# Patient Record
Sex: Male | Born: 1935 | Race: White | Hispanic: No | Smoking: Never smoker
Health system: Southern US, Community
[De-identification: ages and names within clinical notes are randomized; demographics above are authoritative.]

## PROBLEM LIST (undated history)

## (undated) DIAGNOSIS — F0390 Unspecified dementia without behavioral disturbance: Secondary | ICD-10-CM

## (undated) DIAGNOSIS — I10 Essential (primary) hypertension: Secondary | ICD-10-CM

## (undated) HISTORY — PX: KNEE SURGERY: SHX244

---

## 2016-12-01 ENCOUNTER — Emergency Department (HOSPITAL_COMMUNITY): Payer: Medicare Other

## 2016-12-01 ENCOUNTER — Emergency Department (HOSPITAL_COMMUNITY)
Admission: EM | Admit: 2016-12-01 | Discharge: 2016-12-01 | Disposition: A | Discharge status: Home / Self Care | Payer: Medicare Other | Source: Home / Self Care | Attending: Emergency Medicine | Admitting: Emergency Medicine

## 2016-12-01 ENCOUNTER — Encounter (HOSPITAL_COMMUNITY): Payer: Self-pay | Admitting: Emergency Medicine

## 2016-12-01 DIAGNOSIS — W19XXXA Unspecified fall, initial encounter: Secondary | ICD-10-CM | POA: Insufficient documentation

## 2016-12-01 DIAGNOSIS — Z79899 Other long term (current) drug therapy: Secondary | ICD-10-CM

## 2016-12-01 DIAGNOSIS — M25512 Pain in left shoulder: Secondary | ICD-10-CM | POA: Insufficient documentation

## 2016-12-01 DIAGNOSIS — Y999 Unspecified external cause status: Secondary | ICD-10-CM

## 2016-12-01 DIAGNOSIS — S0990XA Unspecified injury of head, initial encounter: Secondary | ICD-10-CM

## 2016-12-01 DIAGNOSIS — S0003XA Contusion of scalp, initial encounter: Secondary | ICD-10-CM | POA: Insufficient documentation

## 2016-12-01 DIAGNOSIS — I6200 Nontraumatic subdural hemorrhage, unspecified: Secondary | ICD-10-CM | POA: Diagnosis not present

## 2016-12-01 DIAGNOSIS — Y92128 Other place in nursing home as the place of occurrence of the external cause: Secondary | ICD-10-CM | POA: Insufficient documentation

## 2016-12-01 DIAGNOSIS — I10 Essential (primary) hypertension: Secondary | ICD-10-CM

## 2016-12-01 DIAGNOSIS — Y939 Activity, unspecified: Secondary | ICD-10-CM | POA: Insufficient documentation

## 2016-12-01 DIAGNOSIS — S065X9A Traumatic subdural hemorrhage with loss of consciousness of unspecified duration, initial encounter: Secondary | ICD-10-CM | POA: Diagnosis not present

## 2016-12-01 HISTORY — DX: Essential (primary) hypertension: I10

## 2016-12-01 HISTORY — DX: Unspecified dementia, unspecified severity, without behavioral disturbance, psychotic disturbance, mood disturbance, and anxiety: F03.90

## 2016-12-01 LAB — CBC WITH DIFFERENTIAL/PLATELET
BASOS ABS: 0 10*3/uL (ref 0.0–0.1)
Basophils Relative: 0 %
EOS PCT: 3 %
Eosinophils Absolute: 0.3 10*3/uL (ref 0.0–0.7)
HEMATOCRIT: 36.6 % — AB (ref 39.0–52.0)
Hemoglobin: 11.7 g/dL — ABNORMAL LOW (ref 13.0–17.0)
LYMPHS PCT: 14 %
Lymphs Abs: 1.2 10*3/uL (ref 0.7–4.0)
MCH: 28.6 pg (ref 26.0–34.0)
MCHC: 32 g/dL (ref 30.0–36.0)
MCV: 89.5 fL (ref 78.0–100.0)
Monocytes Absolute: 0.6 10*3/uL (ref 0.1–1.0)
Monocytes Relative: 7 %
NEUTROS ABS: 6.5 10*3/uL (ref 1.7–7.7)
Neutrophils Relative %: 76 %
PLATELETS: 185 10*3/uL (ref 150–400)
RBC: 4.09 MIL/uL — AB (ref 4.22–5.81)
RDW: 13.8 % (ref 11.5–15.5)
WBC: 8.6 10*3/uL (ref 4.0–10.5)

## 2016-12-01 LAB — URINALYSIS, ROUTINE W REFLEX MICROSCOPIC
BILIRUBIN URINE: NEGATIVE
GLUCOSE, UA: NEGATIVE mg/dL
HGB URINE DIPSTICK: NEGATIVE
Ketones, ur: NEGATIVE mg/dL
Leukocytes, UA: NEGATIVE
Nitrite: NEGATIVE
PH: 8 (ref 5.0–8.0)
Protein, ur: NEGATIVE mg/dL
SPECIFIC GRAVITY, URINE: 1.009 (ref 1.005–1.030)

## 2016-12-01 LAB — COMPREHENSIVE METABOLIC PANEL
ALBUMIN: 4.1 g/dL (ref 3.5–5.0)
ALT: 23 U/L (ref 17–63)
AST: 26 U/L (ref 15–41)
Alkaline Phosphatase: 61 U/L (ref 38–126)
Anion gap: 9 (ref 5–15)
BUN: 16 mg/dL (ref 6–20)
CHLORIDE: 102 mmol/L (ref 101–111)
CO2: 29 mmol/L (ref 22–32)
Calcium: 8.7 mg/dL — ABNORMAL LOW (ref 8.9–10.3)
Creatinine, Ser: 0.71 mg/dL (ref 0.61–1.24)
GFR calc Af Amer: >60 mL/min (ref >60)
GLUCOSE: 93 mg/dL (ref 65–99)
POTASSIUM: 3.7 mmol/L (ref 3.5–5.1)
Sodium: 140 mmol/L (ref 135–145)
Total Bilirubin: 0.4 mg/dL (ref 0.3–1.2)
Total Protein: 6.7 g/dL (ref 6.5–8.1)

## 2016-12-01 NOTE — ED Provider Notes (Signed)
[facility]-EMERGENCY DEPT Provider Note   CSN: [id number] Arrival date & time: 12/01/16  1324     History   Chief Complaint Chief Complaint  Patient presents with  . Fall    HPI Matthew Hancock is a 82 y.o. male.  HPI Level V caveat due to dementia. Patient is resident of nursing home and was found in shower by staff this morning. Patient does not remember fall. Has contusion to the left scalp and left shoulder. Daughters at bedside. States she is concerned the patient missed his dose of Namenda yesterday and today. Had increased confusion yesterday. Past Medical History:  Diagnosis Date  . Dementia   . Hypertension     There are no active problems to display for this patient.   History reviewed. No pertinent surgical history.     Home Medications    Prior to Admission medications   Medication Sig Start Date End Date Taking? Authorizing Provider  B Complex Vitamins (VITAMIN-B COMPLEX PO) Take 1 tablet by mouth daily.   Yes [provider]  Cholecalciferol (VITAMIN D) 2000 units CAPS Take 2,000 Units by mouth daily.   Yes [provider]  citalopram (CELEXA) 20 MG tablet Take 20 mg by mouth daily.   Yes [provider]  Melatonin 10 MG CAPS Take 10 mg by mouth at bedtime.   Yes [provider]  memantine (NAMENDA) 10 MG tablet Take 10 mg by mouth 2 (two) times daily.   Yes [provider]  mineral oil liquid Place 2 mLs in ear(s) daily as needed.   Yes [provider]  Multiple Vitamins-Minerals (MULTIVITAMIN WITH MINERALS) tablet Take 1 tablet by mouth daily.   Yes [provider]  polyethylene glycol (MIRALAX / GLYCOLAX) packet Take 17 g by mouth daily.   Yes [provider]  potassium chloride SA (K-DUR,KLOR-CON) 20 MEQ tablet Take 20 mEq by mouth daily.   Yes [provider]  sertraline (ZOLOFT) 25 MG tablet Take 25 mg by mouth daily. Once daily for 2 weeks. Then d/c   Yes [provider]    Family History No family history on file.  Social History Social History  Substance Use Topics  . Smoking status: Not on file  . Smokeless tobacco: Not on file  . Alcohol use Not on file     Allergies   Patient has no known allergies.   Review of Systems Review of Systems  Unable to perform ROS: Dementia     Physical Exam Updated Vital Signs BP (!) 154/84   Pulse (!) 56   Temp 97.8 F (36.6 C) (Oral)   Resp 19   SpO2 99%   Physical Exam  Constitutional: He appears well-developed and well-nourished.  Drowsy but arousable.  HENT:  Head: Normocephalic.  Mouth/Throat: Oropharynx is clear and moist. No oropharyngeal exudate.  Contusion to the left temporal scalp.  Eyes: EOM are normal. Pupils are equal, round, and reactive to light.  Neck: Normal range of motion. Neck supple.  No posterior midline cervical tenderness to palpation.  Cardiovascular: Regular rhythm.   Bradycardia  Pulmonary/Chest: Effort normal and breath sounds normal. No respiratory distress. He has no wheezes. He has no rales. He exhibits no tenderness.  Abdominal: Soft. Bowel sounds are normal. There is no tenderness. There is no rebound and no guarding.  Musculoskeletal: Normal range of motion. He exhibits tenderness. He exhibits no edema.  Patient with redness overlying the left anterior lateral shoulder. There is tenderness to palpation and  with range of motion. No obvious deformity. Distal pulses are 2+. Pelvis is stable.  Neurological:  Moves all extremities without focal deficit. Drowsy but arousable. Follows simple commands.  Skin: Skin is warm and dry. Capillary refill takes less than 2 seconds. No rash noted. No erythema.  Nursing note and vitals reviewed.    ED Treatments / Results  Labs (all labs ordered are listed, but only abnormal results are displayed) Labs Reviewed  CBC WITH DIFFERENTIAL/PLATELET - Abnormal; Notable for the following:       Result Value   RBC 4.09 (*)      Hemoglobin 11.7 (*)    HCT 36.6 (*)    All other components within normal limits  COMPREHENSIVE METABOLIC PANEL - Abnormal; Notable for the following:    Calcium 8.7 (*)    All other components within normal limits  URINALYSIS, ROUTINE W REFLEX MICROSCOPIC - Abnormal; Notable for the following:    Color, Urine STRAW (*)    All other components within normal limits    EKG  EKG Interpretation None       Radiology Ct Head Wo Contrast  Result Date: 12/01/2016 CLINICAL DATA:  Unwitnessed fall. Found sitting in shower with the hematoma along the left side of the head and left shoulder. Baseline dementia. EXAM: CT HEAD WITHOUT CONTRAST TECHNIQUE: Contiguous axial images were obtained from the base of the skull through the vertex without intravenous contrast. COMPARISON:  None. FINDINGS: Brain: Mild atrophy and white matter changes are evident. No acute infarct, hemorrhage, or mass lesion is present. Ventricles are of normal size. No significant extra-axial collection is present. Vascular: Atherosclerotic calcifications are present within the cavernous internal carotid artery's bilaterally. No hyperdense vessel is present. Skull: A left frontal scalp hematoma is present. There is no underlying fracture. The calvarium is intact. No focal lytic or blastic lesions are present. Sinuses/Orbits: The paranasal sinuses and mastoid air cells are clear. The globes and orbits are within normal limits bilaterally. IMPRESSION: 1. Left frontal scalp hematoma without underlying fracture. 2. No acute intracranial abnormality. 3. Mild atrophy and white matter disease likely reflects the sequela of chronic microvascular ischemia. Electronically Signed   By: Marin Robertshristopher  Mattern M.D.   On: 12/01/2016 11:13   Dg Shoulder Left  Result Date: 12/01/2016 CLINICAL DATA:  Fall. EXAM: LEFT SHOULDER - 2+ VIEW COMPARISON:  None. FINDINGS: Left shoulder hemi arthroplasty device is identified. The hardware components are in  anatomic alignment. No periprosthetic fracture. Mild degenerative changes noted at the acromioclavicular joint. IMPRESSION: 1. No acute finding. 2. Previous left shoulder arthroplasty. Electronically Signed   By: Signa Kellaylor  Stroud M.D.   On: 12/01/2016 10:01    Procedures Procedures (including critical care time)  Medications Ordered in ED Medications - No data to display   Initial Impression / Assessment and Plan / ED Course  I have reviewed the triage vital signs and the nursing notes.  Pertinent labs & imaging results that were available during my care of the patient were reviewed by me and considered in my medical decision making (see chart for details).     CT head without acute findings. Left shoulder x-ray without evidence of bony abnormality. The patient is more alert and interactive with staff. Ambulating with assistance. Vital signs remained stable. Question whether some of patient's drowsiness may be due to oversedation. Patient does receive prn Ativan as needed for agitation. We'll discharge home with patient's daughter. Return precautions given.  Final Clinical Impressions(s) / ED Diagnoses   Final  diagnoses:  Minor head injury, initial encounter  Fall, initial encounter    New Prescriptions New Prescriptions   No medications on file     Loren Racer, MD 12/01/16 1331

## 2016-12-01 NOTE — ED Triage Notes (Addendum)
[facility] [facility] for [facility] unwitnessed [facility]. Patient was found by staff this morning sitting in the shower with a hematoma to the left head and left shoulder.  Patient alert and oriented to self, history of dementia, orientation reported to be at baseline to EMS per facility.  Patient in no apparent distress at this time.  Able to move all extremities, complains of soreness to left shoulder.  Patient bradycardic with rate in high 40's to low 50's.  Multiple pulse checks recorded from facility to heart rate in mid 50's.

## 2016-12-01 NOTE — ED Notes (Signed)
Got patient into a ghown on the monitor did ekg shown to Dr Ranae PalmsYelverton

## 2016-12-01 NOTE — Discharge Instructions (Signed)
Mr Lennon AlstromBayliss needs to ambulate with walker or cane. May use Tylenol 650 mg every 4 hours as needed for pain. Apply icepack to scalp contusion or left shoulder contusion as needed.

## 2016-12-02 ENCOUNTER — Emergency Department (HOSPITAL_COMMUNITY): Payer: Medicare Other

## 2016-12-02 ENCOUNTER — Inpatient Hospital Stay (HOSPITAL_COMMUNITY)
Admission: EM | Admit: 2016-12-02 | Discharge: 2016-12-04 | DRG: 083 | Disposition: A | Discharge status: Skilled Nursing Facility | Payer: Medicare Other | Attending: Internal Medicine | Admitting: Internal Medicine

## 2016-12-02 ENCOUNTER — Encounter (HOSPITAL_COMMUNITY): Payer: Self-pay | Admitting: Plastic Surgery

## 2016-12-02 DIAGNOSIS — I6200 Nontraumatic subdural hemorrhage, unspecified: Secondary | ICD-10-CM

## 2016-12-02 DIAGNOSIS — Z79899 Other long term (current) drug therapy: Secondary | ICD-10-CM

## 2016-12-02 DIAGNOSIS — R350 Frequency of micturition: Secondary | ICD-10-CM | POA: Diagnosis present

## 2016-12-02 DIAGNOSIS — I4510 Unspecified right bundle-branch block: Secondary | ICD-10-CM | POA: Diagnosis present

## 2016-12-02 DIAGNOSIS — R930 Abnormal findings on diagnostic imaging of skull and head, not elsewhere classified: Secondary | ICD-10-CM | POA: Diagnosis present

## 2016-12-02 DIAGNOSIS — W19XXXA Unspecified fall, initial encounter: Secondary | ICD-10-CM | POA: Diagnosis not present

## 2016-12-02 DIAGNOSIS — Y92121 Bathroom in nursing home as the place of occurrence of the external cause: Secondary | ICD-10-CM

## 2016-12-02 DIAGNOSIS — F0390 Unspecified dementia without behavioral disturbance: Secondary | ICD-10-CM | POA: Diagnosis not present

## 2016-12-02 DIAGNOSIS — K5900 Constipation, unspecified: Secondary | ICD-10-CM | POA: Diagnosis present

## 2016-12-02 DIAGNOSIS — F05 Delirium due to known physiological condition: Secondary | ICD-10-CM | POA: Diagnosis present

## 2016-12-02 DIAGNOSIS — R9431 Abnormal electrocardiogram [ECG] [EKG]: Secondary | ICD-10-CM | POA: Diagnosis present

## 2016-12-02 DIAGNOSIS — I4581 Long QT syndrome: Secondary | ICD-10-CM | POA: Diagnosis present

## 2016-12-02 DIAGNOSIS — S065XAA Traumatic subdural hemorrhage with loss of consciousness status unknown, initial encounter: Secondary | ICD-10-CM | POA: Diagnosis present

## 2016-12-02 DIAGNOSIS — L8990 Pressure ulcer of unspecified site, unspecified stage: Secondary | ICD-10-CM | POA: Diagnosis present

## 2016-12-02 DIAGNOSIS — S065X9A Traumatic subdural hemorrhage with loss of consciousness of unspecified duration, initial encounter: Secondary | ICD-10-CM | POA: Diagnosis present

## 2016-12-02 DIAGNOSIS — N400 Enlarged prostate without lower urinary tract symptoms: Secondary | ICD-10-CM | POA: Diagnosis present

## 2016-12-02 DIAGNOSIS — H9190 Unspecified hearing loss, unspecified ear: Secondary | ICD-10-CM | POA: Diagnosis present

## 2016-12-02 DIAGNOSIS — N401 Enlarged prostate with lower urinary tract symptoms: Secondary | ICD-10-CM | POA: Diagnosis present

## 2016-12-02 DIAGNOSIS — S62525A Nondisplaced fracture of distal phalanx of left thumb, initial encounter for closed fracture: Secondary | ICD-10-CM | POA: Diagnosis present

## 2016-12-02 DIAGNOSIS — W182XXA Fall in (into) shower or empty bathtub, initial encounter: Secondary | ICD-10-CM | POA: Diagnosis present

## 2016-12-02 DIAGNOSIS — I10 Essential (primary) hypertension: Secondary | ICD-10-CM | POA: Diagnosis present

## 2016-12-02 DIAGNOSIS — S0011XA Contusion of right eyelid and periocular area, initial encounter: Secondary | ICD-10-CM | POA: Diagnosis present

## 2016-12-02 DIAGNOSIS — D72829 Elevated white blood cell count, unspecified: Secondary | ICD-10-CM | POA: Diagnosis present

## 2016-12-02 DIAGNOSIS — E785 Hyperlipidemia, unspecified: Secondary | ICD-10-CM | POA: Diagnosis present

## 2016-12-02 DIAGNOSIS — F329 Major depressive disorder, single episode, unspecified: Secondary | ICD-10-CM | POA: Diagnosis present

## 2016-12-02 DIAGNOSIS — Y93E1 Activity, personal bathing and showering: Secondary | ICD-10-CM

## 2016-12-02 DIAGNOSIS — Y92009 Unspecified place in unspecified non-institutional (private) residence as the place of occurrence of the external cause: Secondary | ICD-10-CM

## 2016-12-02 DIAGNOSIS — E876 Hypokalemia: Secondary | ICD-10-CM | POA: Diagnosis present

## 2016-12-02 LAB — CBC WITH DIFFERENTIAL/PLATELET
Basophils Absolute: 0 10*3/uL (ref 0.0–0.1)
Basophils Relative: 0 %
EOS ABS: 0 10*3/uL (ref 0.0–0.7)
EOS PCT: 0 %
HCT: 34.9 % — ABNORMAL LOW (ref 39.0–52.0)
Hemoglobin: 11 g/dL — ABNORMAL LOW (ref 13.0–17.0)
LYMPHS ABS: 0.8 10*3/uL (ref 0.7–4.0)
Lymphocytes Relative: 6 %
MCH: 28.1 pg (ref 26.0–34.0)
MCHC: 31.5 g/dL (ref 30.0–36.0)
MCV: 89.3 fL (ref 78.0–100.0)
MONO ABS: 0.9 10*3/uL (ref 0.1–1.0)
MONOS PCT: 7 %
Neutro Abs: 12.2 10*3/uL — ABNORMAL HIGH (ref 1.7–7.7)
Neutrophils Relative %: 87 %
PLATELETS: 191 10*3/uL (ref 150–400)
RBC: 3.91 MIL/uL — ABNORMAL LOW (ref 4.22–5.81)
RDW: 13.7 % (ref 11.5–15.5)
WBC: 13.9 10*3/uL — ABNORMAL HIGH (ref 4.0–10.5)

## 2016-12-02 LAB — BASIC METABOLIC PANEL
Anion gap: 10 (ref 5–15)
BUN: 16 mg/dL (ref 6–20)
CALCIUM: 8.6 mg/dL — AB (ref 8.9–10.3)
CO2: 27 mmol/L (ref 22–32)
CREATININE: 0.8 mg/dL (ref 0.61–1.24)
Chloride: 101 mmol/L (ref 101–111)
GFR calc Af Amer: >60 mL/min (ref >60)
Glucose, Bld: 132 mg/dL — ABNORMAL HIGH (ref 65–99)
Potassium: 3 mmol/L — ABNORMAL LOW (ref 3.5–5.1)
SODIUM: 138 mmol/L (ref 135–145)

## 2016-12-02 LAB — URINALYSIS, ROUTINE W REFLEX MICROSCOPIC
BACTERIA UA: NONE SEEN
Bilirubin Urine: NEGATIVE
GLUCOSE, UA: NEGATIVE mg/dL
Ketones, ur: 5 mg/dL — AB
Leukocytes, UA: NEGATIVE
Nitrite: NEGATIVE
PH: 7 (ref 5.0–8.0)
Protein, ur: NEGATIVE mg/dL
Specific Gravity, Urine: 1.009 (ref 1.005–1.030)

## 2016-12-02 LAB — PROTIME-INR
INR: 1.07
PROTHROMBIN TIME: 14 s (ref 11.4–15.2)

## 2016-12-02 LAB — TROPONIN I

## 2016-12-02 MED ORDER — LORAZEPAM 2 MG/ML IJ SOLN
INTRAMUSCULAR | Status: AC
  Administered 2016-12-02: 1 mg
  Filled 2016-12-02: qty 1

## 2016-12-02 MED ORDER — HALOPERIDOL LACTATE 5 MG/ML IJ SOLN: 1.0000 mg | Freq: Four times a day (QID) | INTRAMUSCULAR | Status: DC | PRN

## 2016-12-02 MED ORDER — POLYETHYLENE GLYCOL 3350 17 G PO PACK
17.0000 g | PACK | ORAL | Status: DC
  Administered 2016-12-03: 17 g via ORAL
  Filled 2016-12-02: qty 1

## 2016-12-02 MED ORDER — SODIUM CHLORIDE 0.9% FLUSH
3.0000 mL | Freq: Two times a day (BID) | INTRAVENOUS | Status: DC
  Administered 2016-12-02 – 2016-12-04 (×5): 3 mL via INTRAVENOUS

## 2016-12-02 MED ORDER — POTASSIUM CHLORIDE IN NACL 40-0.9 MEQ/L-% IV SOLN
INTRAVENOUS | Status: AC
  Administered 2016-12-02: 100 mL/h via INTRAVENOUS
  Filled 2016-12-02: qty 1000

## 2016-12-02 MED ORDER — SERTRALINE HCL 25 MG PO TABS
25.0000 mg | ORAL_TABLET | Freq: Every day | ORAL | Status: DC
  Administered 2016-12-02: 25 mg via ORAL
  Filled 2016-12-02: qty 1

## 2016-12-02 MED ORDER — MEMANTINE HCL 10 MG PO TABS
10.0000 mg | ORAL_TABLET | Freq: Two times a day (BID) | ORAL | Status: DC
  Administered 2016-12-02 – 2016-12-04 (×5): 10 mg via ORAL
  Filled 2016-12-02 (×7): qty 1

## 2016-12-02 MED ORDER — ACETAMINOPHEN 650 MG RE SUPP: 650.0000 mg | Freq: Four times a day (QID) | RECTAL | Status: DC | PRN

## 2016-12-02 MED ORDER — LORAZEPAM 2 MG/ML IJ SOLN
1.0000 mg | Freq: Three times a day (TID) | INTRAMUSCULAR | Status: DC | PRN
  Administered 2016-12-03: 1 mg via INTRAVENOUS
  Filled 2016-12-02: qty 1

## 2016-12-02 MED ORDER — ACETAMINOPHEN 325 MG PO TABS: 650.0000 mg | ORAL_TABLET | Freq: Four times a day (QID) | ORAL | Status: DC | PRN

## 2016-12-02 MED ORDER — POTASSIUM CHLORIDE CRYS ER 20 MEQ PO TBCR
40.0000 meq | EXTENDED_RELEASE_TABLET | Freq: Once | ORAL | Status: AC
  Administered 2016-12-02: 40 meq via ORAL
  Filled 2016-12-02: qty 2

## 2016-12-02 MED ORDER — HALOPERIDOL LACTATE 5 MG/ML IJ SOLN
2.0000 mg | Freq: Four times a day (QID) | INTRAMUSCULAR | Status: DC | PRN
  Administered 2016-12-02: 2 mg via INTRAVENOUS
  Filled 2016-12-02: qty 1

## 2016-12-02 MED ORDER — POTASSIUM CHLORIDE IN NACL 40-0.9 MEQ/L-% IV SOLN: INTRAVENOUS | Status: DC

## 2016-12-02 MED ORDER — POTASSIUM CHLORIDE CRYS ER 20 MEQ PO TBCR
20.0000 meq | EXTENDED_RELEASE_TABLET | Freq: Every day | ORAL | Status: DC
  Administered 2016-12-03: 20 meq via ORAL
  Filled 2016-12-02: qty 1

## 2016-12-02 MED ORDER — ONDANSETRON HCL 4 MG/2ML IJ SOLN
4.0000 mg | Freq: Four times a day (QID) | INTRAMUSCULAR | Status: DC | PRN
  Filled 2016-12-02: qty 2

## 2016-12-02 MED ORDER — ONDANSETRON HCL 4 MG PO TABS: 4.0000 mg | ORAL_TABLET | Freq: Four times a day (QID) | ORAL | Status: DC | PRN

## 2016-12-02 MED ORDER — CITALOPRAM HYDROBROMIDE 10 MG PO TABS
20.0000 mg | ORAL_TABLET | Freq: Every day | ORAL | Status: DC
  Administered 2016-12-02: 20 mg via ORAL
  Filled 2016-12-02: qty 2

## 2016-12-02 NOTE — Progress Notes (Addendum)
PROGRESS NOTE                                                                                                                                                                                                             Patient Demographics:    Matthew Hancock, is a 83 y.o. male, DOB - 01/09/1928, AVW:[id number]  Admit date - 12/02/2016   Admitting Physician No admitting provider for patient encounter.  Outpatient Primary MD for the patient is System, Provider Not In  LOS - 0  Outpatient Specialists: none  Chief Complaint  Patient presents with  . Fall       Brief Narrative   83 year old male with severe dementia, resident of a memory care unit brought by EMS after found down on the floor. He had an unwitnessed fall while in the shower on the morning of admission, brought to the ED and found to have multiple bruising on the left side of the body. Head CT and left shoulder x-ray were negative and patient was discharged back to the facility. His daughter was checking him frequently at the memory care unit. Patient had his supper but was found on the floor again around 11 PM and sent back to the ED. He was found to have new bruising around his right eye and drowsy. No witnessed seizures, nausea, vomiting, fever, chills, bowel or urinary incontinence. Repeat head CT in the ED showed 5 mm subdural hematoma over the left parietal lobe  and blood in the maxillary sinus. Also had a nondisplaced fracture involving his left thumb. Vitals in the ED was stable. Blood work showed WBC of 13.9 K, potassium of 3. EKG with normal sinus rhythm, PACs and RBBB. Chest x-ray unremarkable. Neurosurgeon consulted by ED physician who recommended no surgical intervention and monitor. Trauma surgery was consulted and hospitalist admission requested to stepdown unit.    Subjective:   Patient awake and alert, confused at baseline.   Assessment  & Plan :     Principal Problem:   Subdural hematoma (HCC) Secondary to mechanical fall. Admit to stepdown unit. Neuro checks per unit protocol. Avoid narcotics or benzodiazepine as was possible. -No surgical intervention per neurosurgery. -Repeat head CT without contrast in a.m.  Active Problems: Severe dementia with delirium As per daughter at bedside he is currently at baseline mental status. Continue Namenda. Ordered  Youth worker. Given  low dose haldol for agitation. His Qtc is prolonged (527). Will try a low dose ativan for combativeness.     Leukocytosis Possibly reactive due to acute trauma. No signs of infection. Monitor  Maxillary sinus hemorrhage. Secondary to acute trauma. Seen by trauma surgery and recommends no surgical intervention. Keep head of bed elevated, no  nose blowing and ice to face to reduce swelling and bruising.    Hypokalemia Replenish with IV fluids.  First distal phalanx fracture. Possible nondisplaced fracture on x-ray. Splint applied in the ED. Outpatient hand surgery evaluation.  Resume remaining home medications Zoloft, Celexa     Code Status : Full code (discussed with daughter)  Family Communication  : Daughter at bedside  Disposition Plan  : admit to SDU  Barriers For Discharge : Active symptoms  Consults  :   Trauma surgery (Dr. Ulice Bold) Dr   Procedures  :  CT head  DVT Prophylaxis  :  SCD  Lab Results  Component Value Date   PLT 191 12/02/2016    Antibiotics  :    Anti-infectives    None        Objective:   Vitals:   12/02/16 0445 12/02/16 0500 12/02/16 0515 12/02/16 0724  BP:  (!) 151/72 (!) 143/82 135/77  Pulse: (!) 54 (!) 59 62 66  Resp:   18 12  Temp:      TempSrc:      SpO2: 94% 98% 97% 100%    Wt Readings from Last 3 Encounters:  No data found for Wt    No intake or output data in the 24 hours ending 12/02/16 0853   Physical Exam  Gen: not in distress HEENT: ecchymoses of rt periorbital  area, pupils reactive b/l,  moist mucosa, supple neck Chest: clear b/l, no added sounds CVS: N S1&S2, no murmurs, rubs or gallop GI: soft, NT, ND, BS+ Musculoskeletal: warm, no edema, splinting over left hand CNS: alert and awake to commands, moves all limbs, not oriented    Data Review:    CBC  Recent Labs Lab 12/01/16 1007 12/02/16 0041  WBC 8.6 13.9*  HGB 11.7* 11.0*  HCT 36.6* 34.9*  PLT 185 191  MCV 89.5 89.3  MCH 28.6 28.1  MCHC 32.0 31.5  RDW 13.8 13.7  LYMPHSABS 1.2 0.8  MONOABS 0.6 0.9  EOSABS 0.3 0.0  BASOSABS 0.0 0.0    Chemistries   Recent Labs Lab 12/01/16 1007 12/02/16 0041  NA 140 138  K 3.7 3.0*  CL 102 101  CO2 29 27  GLUCOSE 93 132*  BUN 16 16  CREATININE 0.71 0.80  CALCIUM 8.7* 8.6*  AST 26  --   ALT 23  --   ALKPHOS 61  --   BILITOT 0.4  --    ------------------------------------------------------------------------------------------------------------------ No results for input(s): CHOL, HDL, LDLCALC, TRIG, CHOLHDL, LDLDIRECT in the last 72 hours.  No results found for: HGBA1C ------------------------------------------------------------------------------------------------------------------ No results for input(s): TSH, T4TOTAL, T3FREE, THYROIDAB in the last 72 hours.  Invalid input(s): FREET3 ------------------------------------------------------------------------------------------------------------------ No results for input(s): VITAMINB12, FOLATE, FERRITIN, TIBC, IRON, RETICCTPCT in the last 72 hours.  Coagulation profile No results for input(s): INR, PROTIME in the last 168 hours.  No results for input(s): DDIMER in the last 72 hours.  Cardiac Enzymes  Recent Labs Lab 12/02/16 0041  TROPONINI <0.03   ------------------------------------------------------------------------------------------------------------------ No results found for: BNP  Inpatient Medications  Scheduled Meds: . citalopram  20 mg Oral Daily  .  memantine  10 mg Oral BID  . [START ON 12/03/2016] polyethylene glycol  17 g Oral Q3 days  . potassium chloride SA  20 mEq Oral Daily  . potassium chloride  40 mEq Oral Once  . sertraline  25 mg Oral Daily  . sodium chloride flush  3 mL Intravenous Q12H   Continuous Infusions: . 0.9 % NaCl with KCl 40 mEq / L     PRN Meds:.acetaminophen **OR** acetaminophen, ondansetron **OR** ondansetron (ZOFRAN) IV  Micro Results No results found for this or any previous visit (from the past 240 hour(s)).  Radiology Reports Dg Chest 2 View  Result Date: 12/02/2016 CLINICAL DATA:  Status post unwitnessed fall. Concern for chest injury. Initial encounter. EXAM: CHEST  2 VIEW COMPARISON:  None. FINDINGS: The lungs are well-aerated and clear. There is no evidence of focal opacification, pleural effusion or pneumothorax. The heart is borderline normal in size. No acute osseous abnormalities are seen. Anterior bridging osteophytes are seen along the thoracic spine. The patient's right shoulder arthroplasty is only minimally imaged. IMPRESSION: No acute cardiopulmonary process seen. No displaced rib fracture seen. Electronically Signed   By: Roanna Raider M.D.   On: 12/02/2016 02:04   Ct Head Wo Contrast  Result Date: 12/02/2016 CLINICAL DATA:  Status post unwitnessed fall. Bruising about the right forehead and right orbit. Vomiting. Concern for head or cervical spine injury. Initial encounter. EXAM: CT HEAD WITHOUT CONTRAST CT MAXILLOFACIAL WITHOUT CONTRAST CT CERVICAL SPINE WITHOUT CONTRAST TECHNIQUE: Multidetector CT imaging of the head, cervical spine, and maxillofacial structures were performed using the standard protocol without intravenous contrast. Multiplanar CT image reconstructions of the cervical spine and maxillofacial structures were also generated. COMPARISON:  CT of the head performed 12/01/2016 FINDINGS: CT HEAD FINDINGS Brain: There is suggestion of a small 5 mm acute subdural hematoma overlying  the left parietal lobe, new from the recent prior study. Prominence of the ventricles and sulci reflects mild to moderate cortical volume loss. Scattered periventricular and subcortical white matter change likely reflects small vessel ischemic microangiopathy. The brainstem and fourth ventricle are within normal limits. The basal ganglia are unremarkable in appearance. The cerebral hemispheres demonstrate grossly normal gray-white differentiation. No mass effect or midline shift is seen. Vascular: No hyperdense vessel or unexpected calcification. Skull: There is no evidence of fracture; visualized osseous structures are unremarkable in appearance. Other: Prominent soft tissue swelling is noted overlying the right frontal and parietal calvarium, and surrounding the right orbit. Mild soft tissue air at the right side of the nose may reflect an underlying soft tissue laceration. CT MAXILLOFACIAL FINDINGS Osseous: There is no evidence of fracture or dislocation. The maxilla and mandible appear intact. The nasal bone is unremarkable in appearance. Maxillary and mandibular dental caries are noted. Orbits: The orbits are intact bilaterally. Sinuses: Blood is noted partially filling the right maxillary sinus. The remaining visualized paranasal sinuses and mastoid air cells are well-aerated. Soft tissues: Prominent soft tissue swelling noted overlying the right frontal and parietal calvarium, and surrounding the right orbit to. The parapharyngeal fat planes are preserved. The nasopharynx, oropharynx and hypopharynx are unremarkable in appearance. The visualized portions of the valleculae and piriform sinuses are grossly unremarkable. The parotid and submandibular glands are within normal limits. No cervical lymphadenopathy is seen. CT CERVICAL SPINE FINDINGS Alignment: Normal. Skull base and vertebrae: No acute fracture. No primary bone lesion or focal pathologic process. Soft tissues and spinal canal: No prevertebral fluid  or swelling. No visible canal hematoma. Disc levels:  Mild intervertebral disc space narrowing is noted at C3-C4 and at C6-C7. Underlying facet disease is noted at the mid cervical spine. Upper chest: A 3.4 cm hypodensity is noted at the right thyroid lobe. The visualized lung apices are clear. Other: No additional soft tissue abnormalities are seen. IMPRESSION: 1. Suggestion of small 5 mm acute subdural hematoma overlying the left parietal lobe, new from the recent prior study. 2. No evidence of fracture or subluxation along the cervical spine. 3. Prominent soft tissue swelling overlying the right frontal and parietal calvarium, and surrounding the right orbit. Mild soft tissue air at the right side of the nose may reflect an underlying soft tissue laceration. 4. Blood noted partially filling the right maxillary sinus, new from the recent prior study. 5. Mild to moderate cortical volume loss and scattered small vessel ischemic microangiopathy. 6. Maxillary and mandibular dental caries noted. 7. Mild degenerative change along the cervical spine. 8. **An incidental finding of potential clinical significance has been found. 3.4 cm hypodensity at the right thyroid lobe. Recommend further evaluation with thyroid ultrasound. If patient is clinically hyperthyroid, consider nuclear medicine thyroid uptake and scan.** Critical Value/emergent results were called by telephone at the time of interpretation on 12/02/2016 at 3:04 am to Dr. Glynn Octave, who verbally acknowledged these results. Electronically Signed   By: Roanna Raider M.D.   On: 12/02/2016 03:05   Ct Head Wo Contrast  Result Date: 12/01/2016 CLINICAL DATA:  Unwitnessed fall. Found sitting in shower with the hematoma along the left side of the head and left shoulder. Baseline dementia. EXAM: CT HEAD WITHOUT CONTRAST TECHNIQUE: Contiguous axial images were obtained from the base of the skull through the vertex without intravenous contrast. COMPARISON:  None.  FINDINGS: Brain: Mild atrophy and white matter changes are evident. No acute infarct, hemorrhage, or mass lesion is present. Ventricles are of normal size. No significant extra-axial collection is present. Vascular: Atherosclerotic calcifications are present within the cavernous internal carotid artery's bilaterally. No hyperdense vessel is present. Skull: A left frontal scalp hematoma is present. There is no underlying fracture. The calvarium is intact. No focal lytic or blastic lesions are present. Sinuses/Orbits: The paranasal sinuses and mastoid air cells are clear. The globes and orbits are within normal limits bilaterally. IMPRESSION: 1. Left frontal scalp hematoma without underlying fracture. 2. No acute intracranial abnormality. 3. Mild atrophy and white matter disease likely reflects the sequela of chronic microvascular ischemia. Electronically Signed   By: Marin Roberts M.D.   On: 12/01/2016 11:13   Ct Cervical Spine Wo Contrast  Result Date: 12/02/2016 CLINICAL DATA:  Status post unwitnessed fall. Bruising about the right forehead and right orbit. Vomiting. Concern for head or cervical spine injury. Initial encounter. EXAM: CT HEAD WITHOUT CONTRAST CT MAXILLOFACIAL WITHOUT CONTRAST CT CERVICAL SPINE WITHOUT CONTRAST TECHNIQUE: Multidetector CT imaging of the head, cervical spine, and maxillofacial structures were performed using the standard protocol without intravenous contrast. Multiplanar CT image reconstructions of the cervical spine and maxillofacial structures were also generated. COMPARISON:  CT of the head performed 12/01/2016 FINDINGS: CT HEAD FINDINGS Brain: There is suggestion of a small 5 mm acute subdural hematoma overlying the left parietal lobe, new from the recent prior study. Prominence of the ventricles and sulci reflects mild to moderate cortical volume loss. Scattered periventricular and subcortical white matter change likely reflects small vessel ischemic microangiopathy.  The brainstem and fourth ventricle are within normal limits. The basal ganglia are unremarkable in appearance. The cerebral hemispheres demonstrate grossly normal  gray-white differentiation. No mass effect or midline shift is seen. Vascular: No hyperdense vessel or unexpected calcification. Skull: There is no evidence of fracture; visualized osseous structures are unremarkable in appearance. Other: Prominent soft tissue swelling is noted overlying the right frontal and parietal calvarium, and surrounding the right orbit. Mild soft tissue air at the right side of the nose may reflect an underlying soft tissue laceration. CT MAXILLOFACIAL FINDINGS Osseous: There is no evidence of fracture or dislocation. The maxilla and mandible appear intact. The nasal bone is unremarkable in appearance. Maxillary and mandibular dental caries are noted. Orbits: The orbits are intact bilaterally. Sinuses: Blood is noted partially filling the right maxillary sinus. The remaining visualized paranasal sinuses and mastoid air cells are well-aerated. Soft tissues: Prominent soft tissue swelling noted overlying the right frontal and parietal calvarium, and surrounding the right orbit to. The parapharyngeal fat planes are preserved. The nasopharynx, oropharynx and hypopharynx are unremarkable in appearance. The visualized portions of the valleculae and piriform sinuses are grossly unremarkable. The parotid and submandibular glands are within normal limits. No cervical lymphadenopathy is seen. CT CERVICAL SPINE FINDINGS Alignment: Normal. Skull base and vertebrae: No acute fracture. No primary bone lesion or focal pathologic process. Soft tissues and spinal canal: No prevertebral fluid or swelling. No visible canal hematoma. Disc levels: Mild intervertebral disc space narrowing is noted at C3-C4 and at C6-C7. Underlying facet disease is noted at the mid cervical spine. Upper chest: A 3.4 cm hypodensity is noted at the right thyroid lobe. The  visualized lung apices are clear. Other: No additional soft tissue abnormalities are seen. IMPRESSION: 1. Suggestion of small 5 mm acute subdural hematoma overlying the left parietal lobe, new from the recent prior study. 2. No evidence of fracture or subluxation along the cervical spine. 3. Prominent soft tissue swelling overlying the right frontal and parietal calvarium, and surrounding the right orbit. Mild soft tissue air at the right side of the nose may reflect an underlying soft tissue laceration. 4. Blood noted partially filling the right maxillary sinus, new from the recent prior study. 5. Mild to moderate cortical volume loss and scattered small vessel ischemic microangiopathy. 6. Maxillary and mandibular dental caries noted. 7. Mild degenerative change along the cervical spine. 8. **An incidental finding of potential clinical significance has been found. 3.4 cm hypodensity at the right thyroid lobe. Recommend further evaluation with thyroid ultrasound. If patient is clinically hyperthyroid, consider nuclear medicine thyroid uptake and scan.** Critical Value/emergent results were called by telephone at the time of interpretation on 12/02/2016 at 3:04 am to Dr. Glynn Octave, who verbally acknowledged these results. Electronically Signed   By: Roanna Raider M.D.   On: 12/02/2016 03:05   Dg Shoulder Left  Result Date: 12/02/2016 CLINICAL DATA:  Status post fall, with left shoulder bruising. Initial encounter. EXAM: LEFT SHOULDER - 2+ VIEW COMPARISON:  Left shoulder radiographs performed 12/01/2016 FINDINGS: There is no evidence of fracture or dislocation. The patient's left shoulder arthroplasty appears grossly intact, without evidence of loosening. Degenerative change is noted about the glenoid. Mild degenerative change is noted at the left acromioclavicular joint. No definite soft tissue abnormalities are characterized on radiograph. IMPRESSION: No evidence of fracture or dislocation. Left shoulder  arthroplasty appears grossly intact, without evidence of loosening. Electronically Signed   By: Roanna Raider M.D.   On: 12/02/2016 02:07   Dg Shoulder Left  Result Date: 12/01/2016 CLINICAL DATA:  Fall. EXAM: LEFT SHOULDER - 2+ VIEW COMPARISON:  None. FINDINGS: Left shoulder  hemi arthroplasty device is identified. The hardware components are in anatomic alignment. No periprosthetic fracture. Mild degenerative changes noted at the acromioclavicular joint. IMPRESSION: 1. No acute finding. 2. Previous left shoulder arthroplasty. Electronically Signed   By: Signa Kell M.D.   On: 12/01/2016 10:01   Dg Knee Complete 4 Views Left  Result Date: 12/02/2016 CLINICAL DATA:  Status post unwitnessed fall, with left knee bruising. Initial encounter. EXAM: LEFT KNEE - COMPLETE 4+ VIEW COMPARISON:  None. FINDINGS: There is no evidence of fracture or dislocation. The patient's total knee arthroplasty is grossly unremarkable in appearance, without evidence of loosening. A small knee joint effusion noted. Mild diffuse soft tissue swelling is noted about the patella. Degenerative osseous fragments are noted about the distal quadriceps tendon. Scattered vascular calcifications are seen. IMPRESSION: 1. No evidence of fracture or dislocation. 2. Total knee arthroplasty is grossly unremarkable, without evidence of loosening. 3. Small knee joint effusion noted. 4. Scattered vascular calcifications seen. Electronically Signed   By: Roanna Raider M.D.   On: 12/02/2016 02:05   Dg Hand Complete Left  Result Date: 12/02/2016 CLINICAL DATA:  Left hand pain after a fall, hematoma EXAM: LEFT HAND - COMPLETE 3+ VIEW COMPARISON:  None. FINDINGS: Examination is limited by a pose a shin Ing. Irregular linear lucencies are visualized within the distal phalanx of the first digit at the proximal mid and distal aspect. No subluxation. Advanced arthritic changes at the first [facility] joint. Marked DIP and PIP joint arthritis with narrowing and  osteophyte. IMPRESSION: 1. Multiple irregular lucencies within the proximal mid and distal aspect of the first distal phalanx, suspicious for nondisplaced fractures. 2. Marked arthritic change at the wrist and digits. Electronically Signed   By: Jasmine Pang M.D.   On: 12/02/2016 02:59   Ct Maxillofacial Wo Contrast  Result Date: 12/02/2016 CLINICAL DATA:  Status post unwitnessed fall. Bruising about the right forehead and right orbit. Vomiting. Concern for head or cervical spine injury. Initial encounter. EXAM: CT HEAD WITHOUT CONTRAST CT MAXILLOFACIAL WITHOUT CONTRAST CT CERVICAL SPINE WITHOUT CONTRAST TECHNIQUE: Multidetector CT imaging of the head, cervical spine, and maxillofacial structures were performed using the standard protocol without intravenous contrast. Multiplanar CT image reconstructions of the cervical spine and maxillofacial structures were also generated. COMPARISON:  CT of the head performed 12/01/2016 FINDINGS: CT HEAD FINDINGS Brain: There is suggestion of a small 5 mm acute subdural hematoma overlying the left parietal lobe, new from the recent prior study. Prominence of the ventricles and sulci reflects mild to moderate cortical volume loss. Scattered periventricular and subcortical white matter change likely reflects small vessel ischemic microangiopathy. The brainstem and fourth ventricle are within normal limits. The basal ganglia are unremarkable in appearance. The cerebral hemispheres demonstrate grossly normal gray-white differentiation. No mass effect or midline shift is seen. Vascular: No hyperdense vessel or unexpected calcification. Skull: There is no evidence of fracture; visualized osseous structures are unremarkable in appearance. Other: Prominent soft tissue swelling is noted overlying the right frontal and parietal calvarium, and surrounding the right orbit. Mild soft tissue air at the right side of the nose may reflect an underlying soft tissue laceration. CT  MAXILLOFACIAL FINDINGS Osseous: There is no evidence of fracture or dislocation. The maxilla and mandible appear intact. The nasal bone is unremarkable in appearance. Maxillary and mandibular dental caries are noted. Orbits: The orbits are intact bilaterally. Sinuses: Blood is noted partially filling the right maxillary sinus. The remaining visualized paranasal sinuses and mastoid air cells are well-aerated.  Soft tissues: Prominent soft tissue swelling noted overlying the right frontal and parietal calvarium, and surrounding the right orbit to. The parapharyngeal fat planes are preserved. The nasopharynx, oropharynx and hypopharynx are unremarkable in appearance. The visualized portions of the valleculae and piriform sinuses are grossly unremarkable. The parotid and submandibular glands are within normal limits. No cervical lymphadenopathy is seen. CT CERVICAL SPINE FINDINGS Alignment: Normal. Skull base and vertebrae: No acute fracture. No primary bone lesion or focal pathologic process. Soft tissues and spinal canal: No prevertebral fluid or swelling. No visible canal hematoma. Disc levels: Mild intervertebral disc space narrowing is noted at C3-C4 and at C6-C7. Underlying facet disease is noted at the mid cervical spine. Upper chest: A 3.4 cm hypodensity is noted at the right thyroid lobe. The visualized lung apices are clear. Other: No additional soft tissue abnormalities are seen. IMPRESSION: 1. Suggestion of small 5 mm acute subdural hematoma overlying the left parietal lobe, new from the recent prior study. 2. No evidence of fracture or subluxation along the cervical spine. 3. Prominent soft tissue swelling overlying the right frontal and parietal calvarium, and surrounding the right orbit. Mild soft tissue air at the right side of the nose may reflect an underlying soft tissue laceration. 4. Blood noted partially filling the right maxillary sinus, new from the recent prior study. 5. Mild to moderate cortical  volume loss and scattered small vessel ischemic microangiopathy. 6. Maxillary and mandibular dental caries noted. 7. Mild degenerative change along the cervical spine. 8. **An incidental finding of potential clinical significance has been found. 3.4 cm hypodensity at the right thyroid lobe. Recommend further evaluation with thyroid ultrasound. If patient is clinically hyperthyroid, consider nuclear medicine thyroid uptake and scan.** Critical Value/emergent results were called by telephone at the time of interpretation on 12/02/2016 at 3:04 am to Dr. Glynn Octave, who verbally acknowledged these results. Electronically Signed   By: Roanna Raider M.D.   On: 12/02/2016 03:05    Time Spent in minutes  20   Eddie North M.D on 12/02/2016 at 8:53 AM  Between 7am to 7pm - Pager - [phone]  After 7pm go to [email or web address] - password [email or web address] [facility] -  Office  [phone]

## 2016-12-02 NOTE — ED Notes (Signed)
Preferred Number..[phone], Vanessa DurhamJannet Causey (daugter)

## 2016-12-02 NOTE — Progress Notes (Signed)
Orthopedic Tech Progress Note Patient Details:  Matthew FrankelJack Hancock 01/26/1928 [id number]  Ortho Devices Type of Ortho Device: Arm sling, Thumb spica splint Splint Material: Fiberglass Ortho Device/Splint Location: lue Ortho Device/Splint Interventions: Ordered, Application   [name], [name] [name] 12/02/2016, 4:13 AM

## 2016-12-02 NOTE — ED Notes (Signed)
Pt presents for a fall, this being the second time today after evaluation here today. Pt also has new bruises to his face and his left knee. Facility states the patient is unresponsive which is new for him.

## 2016-12-02 NOTE — Consult Note (Signed)
Reason for Consult: facial trauma Referring Physician: Dr. Lily Kocher  Matthew Hancock is an 83 y.o. male.  HPI: The patient is an 83 yrs old wm here with his daughter for evaluation after a fall at the memory care facility.  He had an unwitnessed fall in the shower.  He has bruising on the right side of his face and periorbital area.  He has no signs of entrapment of his eyes. It is tender to touch but there are no bone anomalies or step offs noted.  He has underling dementia.  His daughter is by his side.  I reviewed the CT and don't see a fracture to explain the sinus fluid.   Past Medical History:  Diagnosis Date  . Dementia   . Hypertension     History reviewed. No pertinent surgical history.  History reviewed. No pertinent family history.  Social History:  has no tobacco, alcohol, and drug history on file.  Allergies: No Known Allergies  Medications: I have reviewed the patient's current medications.  Results for orders placed or performed during the hospital encounter of 12/02/16 (from the past 48 hour(s))  CBC with Differential/Platelet     Status: Abnormal   Collection Time: 12/02/16 12:41 AM  Result Value Ref Range   WBC 13.9 (H) 4.0 - 10.5 K/uL   RBC 3.91 (L) 4.22 - 5.81 MIL/uL   Hemoglobin 11.0 (L) 13.0 - 17.0 g/dL   HCT 34.9 (L) 39.0 - 52.0 %   MCV 89.3 78.0 - 100.0 fL   MCH 28.1 26.0 - 34.0 pg   MCHC 31.5 30.0 - 36.0 g/dL   RDW 13.7 11.5 - 15.5 %   Platelets 191 150 - 400 K/uL   Neutrophils Relative % 87 %   Neutro Abs 12.2 (H) 1.7 - 7.7 K/uL   Lymphocytes Relative 6 %   Lymphs Abs 0.8 0.7 - 4.0 K/uL   Monocytes Relative 7 %   Monocytes Absolute 0.9 0.1 - 1.0 K/uL   Eosinophils Relative 0 %   Eosinophils Absolute 0.0 0.0 - 0.7 K/uL   Basophils Relative 0 %   Basophils Absolute 0.0 0.0 - 0.1 K/uL  Basic metabolic panel     Status: Abnormal   Collection Time: 12/02/16 12:41 AM  Result Value Ref Range   Sodium 138 135 - 145 mmol/L   Potassium 3.0 (L) 3.5 -  5.1 mmol/L    Comment: DELTA CHECK NOTED   Chloride 101 101 - 111 mmol/L   CO2 27 22 - 32 mmol/L   Glucose, Bld 132 (H) 65 - 99 mg/dL   BUN 16 6 - 20 mg/dL   Creatinine, Ser 0.80 0.61 - 1.24 mg/dL   Calcium 8.6 (L) 8.9 - 10.3 mg/dL   GFR calc non Af Amer >60 >60 mL/min   GFR calc Af Amer >60 >60 mL/min    Comment: (NOTE) The eGFR has been calculated using the CKD EPI equation. This calculation has not been validated in all clinical situations. eGFR's persistently <60 mL/min signify possible Chronic Kidney Disease.    Anion gap 10 5 - 15  Troponin I     Status: None   Collection Time: 12/02/16 12:41 AM  Result Value Ref Range   Troponin I <0.03 <0.03 ng/mL  Protime-INR     Status: None   Collection Time: 12/02/16  8:35 AM  Result Value Ref Range   Prothrombin Time 14.0 11.4 - 15.2 seconds   INR 1.07     Dg Chest 2 View  Result Date: 12/02/2016 CLINICAL DATA:  Status post unwitnessed fall. Concern for chest injury. Initial encounter. EXAM: CHEST  2 VIEW COMPARISON:  None. FINDINGS: The lungs are well-aerated and clear. There is no evidence of focal opacification, pleural effusion or pneumothorax. The heart is borderline normal in size. No acute osseous abnormalities are seen. Anterior bridging osteophytes are seen along the thoracic spine. The patient's right shoulder arthroplasty is only minimally imaged. IMPRESSION: No acute cardiopulmonary process seen. No displaced rib fracture seen. Electronically Signed   By: Garald Balding M.D.   On: 12/02/2016 02:04   Ct Head Wo Contrast  Result Date: 12/02/2016 CLINICAL DATA:  Status post unwitnessed fall. Bruising about the right forehead and right orbit. Vomiting. Concern for head or cervical spine injury. Initial encounter. EXAM: CT HEAD WITHOUT CONTRAST CT MAXILLOFACIAL WITHOUT CONTRAST CT CERVICAL SPINE WITHOUT CONTRAST TECHNIQUE: Multidetector CT imaging of the head, cervical spine, and maxillofacial structures were performed using the  standard protocol without intravenous contrast. Multiplanar CT image reconstructions of the cervical spine and maxillofacial structures were also generated. COMPARISON:  CT of the head performed 12/01/2016 FINDINGS: CT HEAD FINDINGS Brain: There is suggestion of a small 5 mm acute subdural hematoma overlying the left parietal lobe, new from the recent prior study. Prominence of the ventricles and sulci reflects mild to moderate cortical volume loss. Scattered periventricular and subcortical white matter change likely reflects small vessel ischemic microangiopathy. The brainstem and fourth ventricle are within normal limits. The basal ganglia are unremarkable in appearance. The cerebral hemispheres demonstrate grossly normal gray-white differentiation. No mass effect or midline shift is seen. Vascular: No hyperdense vessel or unexpected calcification. Skull: There is no evidence of fracture; visualized osseous structures are unremarkable in appearance. Other: Prominent soft tissue swelling is noted overlying the right frontal and parietal calvarium, and surrounding the right orbit. Mild soft tissue air at the right side of the nose may reflect an underlying soft tissue laceration. CT MAXILLOFACIAL FINDINGS Osseous: There is no evidence of fracture or dislocation. The maxilla and mandible appear intact. The nasal bone is unremarkable in appearance. Maxillary and mandibular dental caries are noted. Orbits: The orbits are intact bilaterally. Sinuses: Blood is noted partially filling the right maxillary sinus. The remaining visualized paranasal sinuses and mastoid air cells are well-aerated. Soft tissues: Prominent soft tissue swelling noted overlying the right frontal and parietal calvarium, and surrounding the right orbit to. The parapharyngeal fat planes are preserved. The nasopharynx, oropharynx and hypopharynx are unremarkable in appearance. The visualized portions of the valleculae and piriform sinuses are grossly  unremarkable. The parotid and submandibular glands are within normal limits. No cervical lymphadenopathy is seen. CT CERVICAL SPINE FINDINGS Alignment: Normal. Skull base and vertebrae: No acute fracture. No primary bone lesion or focal pathologic process. Soft tissues and spinal canal: No prevertebral fluid or swelling. No visible canal hematoma. Disc levels: Mild intervertebral disc space narrowing is noted at C3-C4 and at C6-C7. Underlying facet disease is noted at the mid cervical spine. Upper chest: A 3.4 cm hypodensity is noted at the right thyroid lobe. The visualized lung apices are clear. Other: No additional soft tissue abnormalities are seen. IMPRESSION: 1. Suggestion of small 5 mm acute subdural hematoma overlying the left parietal lobe, new from the recent prior study. 2. No evidence of fracture or subluxation along the cervical spine. 3. Prominent soft tissue swelling overlying the right frontal and parietal calvarium, and surrounding the right orbit. Mild soft tissue air at the right side of the  nose may reflect an underlying soft tissue laceration. 4. Blood noted partially filling the right maxillary sinus, new from the recent prior study. 5. Mild to moderate cortical volume loss and scattered small vessel ischemic microangiopathy. 6. Maxillary and mandibular dental caries noted. 7. Mild degenerative change along the cervical spine. 8. **An incidental finding of potential clinical significance has been found. 3.4 cm hypodensity at the right thyroid lobe. Recommend further evaluation with thyroid ultrasound. If patient is clinically hyperthyroid, consider nuclear medicine thyroid uptake and scan.** Critical Value/emergent results were called by telephone at the time of interpretation on 12/02/2016 at 3:04 am to Dr. Ezequiel Essex, who verbally acknowledged these results. Electronically Signed   By: Garald Balding M.D.   On: 12/02/2016 03:05   Ct Head Wo Contrast  Result Date: 12/01/2016 CLINICAL  DATA:  Unwitnessed fall. Found sitting in shower with the hematoma along the left side of the head and left shoulder. Baseline dementia. EXAM: CT HEAD WITHOUT CONTRAST TECHNIQUE: Contiguous axial images were obtained from the base of the skull through the vertex without intravenous contrast. COMPARISON:  None. FINDINGS: Brain: Mild atrophy and white matter changes are evident. No acute infarct, hemorrhage, or mass lesion is present. Ventricles are of normal size. No significant extra-axial collection is present. Vascular: Atherosclerotic calcifications are present within the cavernous internal carotid artery's bilaterally. No hyperdense vessel is present. Skull: A left frontal scalp hematoma is present. There is no underlying fracture. The calvarium is intact. No focal lytic or blastic lesions are present. Sinuses/Orbits: The paranasal sinuses and mastoid air cells are clear. The globes and orbits are within normal limits bilaterally. IMPRESSION: 1. Left frontal scalp hematoma without underlying fracture. 2. No acute intracranial abnormality. 3. Mild atrophy and white matter disease likely reflects the sequela of chronic microvascular ischemia. Electronically Signed   By: San Morelle M.D.   On: 12/01/2016 11:13   Ct Cervical Spine Wo Contrast  Result Date: 12/02/2016 CLINICAL DATA:  Status post unwitnessed fall. Bruising about the right forehead and right orbit. Vomiting. Concern for head or cervical spine injury. Initial encounter. EXAM: CT HEAD WITHOUT CONTRAST CT MAXILLOFACIAL WITHOUT CONTRAST CT CERVICAL SPINE WITHOUT CONTRAST TECHNIQUE: Multidetector CT imaging of the head, cervical spine, and maxillofacial structures were performed using the standard protocol without intravenous contrast. Multiplanar CT image reconstructions of the cervical spine and maxillofacial structures were also generated. COMPARISON:  CT of the head performed 12/01/2016 FINDINGS: CT HEAD FINDINGS Brain: There is suggestion  of a small 5 mm acute subdural hematoma overlying the left parietal lobe, new from the recent prior study. Prominence of the ventricles and sulci reflects mild to moderate cortical volume loss. Scattered periventricular and subcortical white matter change likely reflects small vessel ischemic microangiopathy. The brainstem and fourth ventricle are within normal limits. The basal ganglia are unremarkable in appearance. The cerebral hemispheres demonstrate grossly normal gray-white differentiation. No mass effect or midline shift is seen. Vascular: No hyperdense vessel or unexpected calcification. Skull: There is no evidence of fracture; visualized osseous structures are unremarkable in appearance. Other: Prominent soft tissue swelling is noted overlying the right frontal and parietal calvarium, and surrounding the right orbit. Mild soft tissue air at the right side of the nose may reflect an underlying soft tissue laceration. CT MAXILLOFACIAL FINDINGS Osseous: There is no evidence of fracture or dislocation. The maxilla and mandible appear intact. The nasal bone is unremarkable in appearance. Maxillary and mandibular dental caries are noted. Orbits: The orbits are intact bilaterally. Sinuses: Blood  is noted partially filling the right maxillary sinus. The remaining visualized paranasal sinuses and mastoid air cells are well-aerated. Soft tissues: Prominent soft tissue swelling noted overlying the right frontal and parietal calvarium, and surrounding the right orbit to. The parapharyngeal fat planes are preserved. The nasopharynx, oropharynx and hypopharynx are unremarkable in appearance. The visualized portions of the valleculae and piriform sinuses are grossly unremarkable. The parotid and submandibular glands are within normal limits. No cervical lymphadenopathy is seen. CT CERVICAL SPINE FINDINGS Alignment: Normal. Skull base and vertebrae: No acute fracture. No primary bone lesion or focal pathologic process.  Soft tissues and spinal canal: No prevertebral fluid or swelling. No visible canal hematoma. Disc levels: Mild intervertebral disc space narrowing is noted at C3-C4 and at C6-C7. Underlying facet disease is noted at the mid cervical spine. Upper chest: A 3.4 cm hypodensity is noted at the right thyroid lobe. The visualized lung apices are clear. Other: No additional soft tissue abnormalities are seen. IMPRESSION: 1. Suggestion of small 5 mm acute subdural hematoma overlying the left parietal lobe, new from the recent prior study. 2. No evidence of fracture or subluxation along the cervical spine. 3. Prominent soft tissue swelling overlying the right frontal and parietal calvarium, and surrounding the right orbit. Mild soft tissue air at the right side of the nose may reflect an underlying soft tissue laceration. 4. Blood noted partially filling the right maxillary sinus, new from the recent prior study. 5. Mild to moderate cortical volume loss and scattered small vessel ischemic microangiopathy. 6. Maxillary and mandibular dental caries noted. 7. Mild degenerative change along the cervical spine. 8. **An incidental finding of potential clinical significance has been found. 3.4 cm hypodensity at the right thyroid lobe. Recommend further evaluation with thyroid ultrasound. If patient is clinically hyperthyroid, consider nuclear medicine thyroid uptake and scan.** Critical Value/emergent results were called by telephone at the time of interpretation on 12/02/2016 at 3:04 am to Dr. Ezequiel Essex, who verbally acknowledged these results. Electronically Signed   By: Garald Balding M.D.   On: 12/02/2016 03:05   Dg Shoulder Left  Result Date: 12/02/2016 CLINICAL DATA:  Status post fall, with left shoulder bruising. Initial encounter. EXAM: LEFT SHOULDER - 2+ VIEW COMPARISON:  Left shoulder radiographs performed 12/01/2016 FINDINGS: There is no evidence of fracture or dislocation. The patient's left shoulder  arthroplasty appears grossly intact, without evidence of loosening. Degenerative change is noted about the glenoid. Mild degenerative change is noted at the left acromioclavicular joint. No definite soft tissue abnormalities are characterized on radiograph. IMPRESSION: No evidence of fracture or dislocation. Left shoulder arthroplasty appears grossly intact, without evidence of loosening. Electronically Signed   By: Garald Balding M.D.   On: 12/02/2016 02:07   Dg Shoulder Left  Result Date: 12/01/2016 CLINICAL DATA:  Fall. EXAM: LEFT SHOULDER - 2+ VIEW COMPARISON:  None. FINDINGS: Left shoulder hemi arthroplasty device is identified. The hardware components are in anatomic alignment. No periprosthetic fracture. Mild degenerative changes noted at the acromioclavicular joint. IMPRESSION: 1. No acute finding. 2. Previous left shoulder arthroplasty. Electronically Signed   By: Kerby Moors M.D.   On: 12/01/2016 10:01   Dg Knee Complete 4 Views Left  Result Date: 12/02/2016 CLINICAL DATA:  Status post unwitnessed fall, with left knee bruising. Initial encounter. EXAM: LEFT KNEE - COMPLETE 4+ VIEW COMPARISON:  None. FINDINGS: There is no evidence of fracture or dislocation. The patient's total knee arthroplasty is grossly unremarkable in appearance, without evidence of loosening. A small knee  joint effusion noted. Mild diffuse soft tissue swelling is noted about the patella. Degenerative osseous fragments are noted about the distal quadriceps tendon. Scattered vascular calcifications are seen. IMPRESSION: 1. No evidence of fracture or dislocation. 2. Total knee arthroplasty is grossly unremarkable, without evidence of loosening. 3. Small knee joint effusion noted. 4. Scattered vascular calcifications seen. Electronically Signed   By: Garald Balding M.D.   On: 12/02/2016 02:05   Dg Hand Complete Left  Result Date: 12/02/2016 CLINICAL DATA:  Left hand pain after a fall, hematoma EXAM: LEFT HAND - COMPLETE 3+  VIEW COMPARISON:  None. FINDINGS: Examination is limited by a pose a shin Ing. Irregular linear lucencies are visualized within the distal phalanx of the first digit at the proximal mid and distal aspect. No subluxation. Advanced arthritic changes at the first [facility] joint. Marked DIP and PIP joint arthritis with narrowing and osteophyte. IMPRESSION: 1. Multiple irregular lucencies within the proximal mid and distal aspect of the first distal phalanx, suspicious for nondisplaced fractures. 2. Marked arthritic change at the wrist and digits. Electronically Signed   By: Donavan Foil M.D.   On: 12/02/2016 02:59   Ct Maxillofacial Wo Contrast  Result Date: 12/02/2016 CLINICAL DATA:  Status post unwitnessed fall. Bruising about the right forehead and right orbit. Vomiting. Concern for head or cervical spine injury. Initial encounter. EXAM: CT HEAD WITHOUT CONTRAST CT MAXILLOFACIAL WITHOUT CONTRAST CT CERVICAL SPINE WITHOUT CONTRAST TECHNIQUE: Multidetector CT imaging of the head, cervical spine, and maxillofacial structures were performed using the standard protocol without intravenous contrast. Multiplanar CT image reconstructions of the cervical spine and maxillofacial structures were also generated. COMPARISON:  CT of the head performed 12/01/2016 FINDINGS: CT HEAD FINDINGS Brain: There is suggestion of a small 5 mm acute subdural hematoma overlying the left parietal lobe, new from the recent prior study. Prominence of the ventricles and sulci reflects mild to moderate cortical volume loss. Scattered periventricular and subcortical white matter change likely reflects small vessel ischemic microangiopathy. The brainstem and fourth ventricle are within normal limits. The basal ganglia are unremarkable in appearance. The cerebral hemispheres demonstrate grossly normal gray-white differentiation. No mass effect or midline shift is seen. Vascular: No hyperdense vessel or unexpected calcification. Skull: There is no  evidence of fracture; visualized osseous structures are unremarkable in appearance. Other: Prominent soft tissue swelling is noted overlying the right frontal and parietal calvarium, and surrounding the right orbit. Mild soft tissue air at the right side of the nose may reflect an underlying soft tissue laceration. CT MAXILLOFACIAL FINDINGS Osseous: There is no evidence of fracture or dislocation. The maxilla and mandible appear intact. The nasal bone is unremarkable in appearance. Maxillary and mandibular dental caries are noted. Orbits: The orbits are intact bilaterally. Sinuses: Blood is noted partially filling the right maxillary sinus. The remaining visualized paranasal sinuses and mastoid air cells are well-aerated. Soft tissues: Prominent soft tissue swelling noted overlying the right frontal and parietal calvarium, and surrounding the right orbit to. The parapharyngeal fat planes are preserved. The nasopharynx, oropharynx and hypopharynx are unremarkable in appearance. The visualized portions of the valleculae and piriform sinuses are grossly unremarkable. The parotid and submandibular glands are within normal limits. No cervical lymphadenopathy is seen. CT CERVICAL SPINE FINDINGS Alignment: Normal. Skull base and vertebrae: No acute fracture. No primary bone lesion or focal pathologic process. Soft tissues and spinal canal: No prevertebral fluid or swelling. No visible canal hematoma. Disc levels: Mild intervertebral disc space narrowing is noted at C3-C4 and  at C6-C7. Underlying facet disease is noted at the mid cervical spine. Upper chest: A 3.4 cm hypodensity is noted at the right thyroid lobe. The visualized lung apices are clear. Other: No additional soft tissue abnormalities are seen. IMPRESSION: 1. Suggestion of small 5 mm acute subdural hematoma overlying the left parietal lobe, new from the recent prior study. 2. No evidence of fracture or subluxation along the cervical spine. 3. Prominent soft  tissue swelling overlying the right frontal and parietal calvarium, and surrounding the right orbit. Mild soft tissue air at the right side of the nose may reflect an underlying soft tissue laceration. 4. Blood noted partially filling the right maxillary sinus, new from the recent prior study. 5. Mild to moderate cortical volume loss and scattered small vessel ischemic microangiopathy. 6. Maxillary and mandibular dental caries noted. 7. Mild degenerative change along the cervical spine. 8. **An incidental finding of potential clinical significance has been found. 3.4 cm hypodensity at the right thyroid lobe. Recommend further evaluation with thyroid ultrasound. If patient is clinically hyperthyroid, consider nuclear medicine thyroid uptake and scan.** Critical Value/emergent results were called by telephone at the time of interpretation on 12/02/2016 at 3:04 am to Dr. Ezequiel Essex, who verbally acknowledged these results. Electronically Signed   By: Garald Balding M.D.   On: 12/02/2016 03:05    Review of Systems  Constitutional: Negative.   HENT: Negative.   Eyes: Negative.   Respiratory: Negative.   Cardiovascular: Negative.   Gastrointestinal: Negative.   Genitourinary: Negative.   Musculoskeletal: Negative.   Skin: Negative.   Psychiatric/Behavioral: Positive for memory loss.   Blood pressure (!) 130/96, pulse 98, temperature 98.4 F (36.9 C), temperature source Axillary, resp. rate 13, SpO2 97 %. Physical Exam  Constitutional: He appears well-developed and well-nourished.  HENT:  Head: Normocephalic.    Eyes: EOM are normal. Pupils are equal, round, and reactive to light.  Cardiovascular: Normal rate.   Respiratory: Effort normal.  GI: Soft. There is no tenderness. There is no rebound and no guarding.  Neurological: He is alert.  Skin: Skin is warm.  Psychiatric: He has a normal mood and affect. His behavior is normal. Judgment and thought content normal.    Assessment/Plan:   Recommend follow up if any signs or symptoms.  Head of bed elevated as able.  No nasal blowing for now.  Ice to face as able for swelling and bruising.  Matthew Hancock 12/02/2016, 2:50 PM

## 2016-12-02 NOTE — ED Notes (Signed)
Tray at bedside. 

## 2016-12-02 NOTE — ED Provider Notes (Signed)
[facility]-EMERGENCY DEPT Provider Note   CSN: [id number] Arrival date & time: 12/02/16  0022  By signing my name below, I, Deland Pretty, attest that this documentation has been prepared under the direction and in the presence of Maquita Sandoval, Jeannett Senior, MD. Electronically Signed: Deland Pretty, ED Scribe. 12/02/16. 12:47 AM.   History   Chief Complaint Chief Complaint  Patient presents with  . Fall    The history is provided by the patient. The history is limited by the condition of the patient. No language interpreter was used.   LEVEL 5 CAVEAT: HPI and ROS limited due to being nonverbal. Doesn't answer questions.   HPI Comments: Matthew Hancock is a 83 y.o. male, with a h/x of dementia, who presents to the Emergency Department complaining of fall that occurred yesterday, after another fall that also occurred yesterday. The pt lives in [facility]. Per triage, the pt is drowsy but would say "what" when name was called.    Past Medical History:  Diagnosis Date  . Dementia   . Hypertension     There are no active problems to display for this patient.   No past surgical history on file.     Home Medications    Prior to Admission medications   Medication Sig Start Date End Date Taking? Authorizing Provider  B Complex Vitamins (VITAMIN-B COMPLEX PO) Take 1 tablet by mouth daily.    [provider]  Cholecalciferol (VITAMIN D) 2000 units CAPS Take 2,000 Units by mouth daily.    [provider]  citalopram (CELEXA) 20 MG tablet Take 20 mg by mouth daily.    [provider]  Melatonin 10 MG CAPS Take 10 mg by mouth at bedtime.    [provider]  memantine (NAMENDA) 10 MG tablet Take 10 mg by mouth 2 (two) times daily.    [provider]  mineral oil liquid Place 2 mLs in ear(s) daily as needed.    [provider]  Multiple Vitamins-Minerals (MULTIVITAMIN WITH MINERALS) tablet Take 1 tablet by mouth  daily.    [provider]  polyethylene glycol (MIRALAX / GLYCOLAX) packet Take 17 g by mouth daily.    [provider]  potassium chloride SA (K-DUR,KLOR-CON) 20 MEQ tablet Take 20 mEq by mouth daily.    [provider]  sertraline (ZOLOFT) 25 MG tablet Take 25 mg by mouth daily. Once daily for 2 weeks. Then d/c    [provider]    Family History No family history on file.  Social History Social History  Substance Use Topics  . Smoking status: Not on file  . Smokeless tobacco: Not on file  . Alcohol use Not on file     Allergies   Patient has no known allergies.   Review of Systems Review of Systems  Unable to perform ROS: Patient nonverbal    10 Systems reviewed and are negative for acute change except as noted in the HPI.   Physical Exam Updated Vital Signs BP (!) 152/80   Pulse 66   Temp 98.4 F (36.9 C) (Axillary)   Resp 13   SpO2 99%   Physical Exam  Constitutional: He appears well-developed and well-nourished. No distress.  Mostly nonverbal  HENT:  Head: Normocephalic and atraumatic.  Mouth/Throat: Oropharynx is clear and moist. No oropharyngeal exudate.  Eyes: Conjunctivae and EOM are normal. Pupils are equal, round, and reactive to light.  Neck: Normal range of motion. Neck supple.  No meningismus.  Cardiovascular: Normal rate, regular rhythm, normal heart sounds and intact distal pulses.   No murmur heard. Pulmonary/Chest: Effort normal and breath sounds normal. No respiratory distress.  Abdominal: Soft. There is no tenderness. There is no rebound and no guarding.  Musculoskeletal: Normal range of motion. He exhibits no edema or tenderness.  Moves all extremities spontaneously with complete ROM. No c-spine tenderness. Full ROM of hips.  Neurological: He is alert. No cranial nerve deficit. He exhibits normal muscle tone. Coordination normal.  Doesn't follow commands.  Skin: Skin is warm.  Ecchymosis and hematoma  to right head with abrasion. Ecchymosis of left medial knee, and left shoulder. Ecchymosis of left dorsal head and right palmar thumb.   Psychiatric: He has a normal mood and affect. His behavior is normal.  Nursing note and vitals reviewed.    ED Treatments / Results   DIAGNOSTIC STUDIES: Oxygen Saturation is 99% on RA, normal by my interpretation.   COORDINATION OF CARE: 12:34 AM   Labs (all labs ordered are listed, but only abnormal results are displayed) Labs Reviewed  CBC WITH DIFFERENTIAL/PLATELET - Abnormal; Notable for the following:       Result Value   WBC 13.9 (*)    RBC 3.91 (*)    Hemoglobin 11.0 (*)    HCT 34.9 (*)    Neutro Abs 12.2 (*)    All other components within normal limits  BASIC METABOLIC PANEL - Abnormal; Notable for the following:    Potassium 3.0 (*)    Glucose, Bld 132 (*)    Calcium 8.6 (*)    All other components within normal limits  TROPONIN I  URINALYSIS, ROUTINE W REFLEX MICROSCOPIC  PROTIME-INR    EKG  EKG Interpretation  Date/Time:  Sunday December 02 2016 00:52:12 EDT Ventricular Rate:  66 PR Interval:    QRS Duration: 187 QT Interval:  502 QTC Calculation: 527 R Axis:   79 Text Interpretation:  Sinus or ectopic atrial rhythm Atrial premature complexes Right bundle branch block No significant change was found Confirmed by Glynn Octave [phone]) on 12/02/2016 1:20:25 AM Also confirmed by Glynn Octave [phone]), editor Misty Stanley [phone])  on 12/02/2016 8:12:51 AM       Radiology Dg Chest 2 View  Result Date: 12/02/2016 CLINICAL DATA:  Status post unwitnessed fall. Concern for chest injury. Initial encounter. EXAM: CHEST  2 VIEW COMPARISON:  None. FINDINGS: The lungs are well-aerated and clear. There is no evidence of focal opacification, pleural effusion or pneumothorax. The heart is borderline normal in size. No acute osseous abnormalities are seen. Anterior bridging osteophytes are seen along the thoracic spine.  The patient's right shoulder arthroplasty is only minimally imaged. IMPRESSION: No acute cardiopulmonary process seen. No displaced rib fracture seen. Electronically Signed   By: Roanna Raider M.D.   On: 12/02/2016 02:04   Ct Head Wo Contrast  Result Date: 12/02/2016 CLINICAL DATA:  Status post unwitnessed fall. Bruising about the right forehead and right orbit. Vomiting. Concern for head or cervical spine injury. Initial encounter. EXAM: CT HEAD WITHOUT CONTRAST CT MAXILLOFACIAL WITHOUT CONTRAST CT CERVICAL SPINE WITHOUT CONTRAST TECHNIQUE: Multidetector CT imaging of the head, cervical spine, and maxillofacial structures were performed using the standard protocol without intravenous contrast. Multiplanar CT image reconstructions of the cervical spine and maxillofacial structures were also generated. COMPARISON:  CT of the head performed 12/01/2016 FINDINGS: CT HEAD FINDINGS Brain: There is suggestion of a small 5 mm acute subdural hematoma overlying the left parietal lobe, new from  the recent prior study. Prominence of the ventricles and sulci reflects mild to moderate cortical volume loss. Scattered periventricular and subcortical white matter change likely reflects small vessel ischemic microangiopathy. The brainstem and fourth ventricle are within normal limits. The basal ganglia are unremarkable in appearance. The cerebral hemispheres demonstrate grossly normal gray-white differentiation. No mass effect or midline shift is seen. Vascular: No hyperdense vessel or unexpected calcification. Skull: There is no evidence of fracture; visualized osseous structures are unremarkable in appearance. Other: Prominent soft tissue swelling is noted overlying the right frontal and parietal calvarium, and surrounding the right orbit. Mild soft tissue air at the right side of the nose may reflect an underlying soft tissue laceration. CT MAXILLOFACIAL FINDINGS Osseous: There is no evidence of fracture or dislocation. The  maxilla and mandible appear intact. The nasal bone is unremarkable in appearance. Maxillary and mandibular dental caries are noted. Orbits: The orbits are intact bilaterally. Sinuses: Blood is noted partially filling the right maxillary sinus. The remaining visualized paranasal sinuses and mastoid air cells are well-aerated. Soft tissues: Prominent soft tissue swelling noted overlying the right frontal and parietal calvarium, and surrounding the right orbit to. The parapharyngeal fat planes are preserved. The nasopharynx, oropharynx and hypopharynx are unremarkable in appearance. The visualized portions of the valleculae and piriform sinuses are grossly unremarkable. The parotid and submandibular glands are within normal limits. No cervical lymphadenopathy is seen. CT CERVICAL SPINE FINDINGS Alignment: Normal. Skull base and vertebrae: No acute fracture. No primary bone lesion or focal pathologic process. Soft tissues and spinal canal: No prevertebral fluid or swelling. No visible canal hematoma. Disc levels: Mild intervertebral disc space narrowing is noted at C3-C4 and at C6-C7. Underlying facet disease is noted at the mid cervical spine. Upper chest: A 3.4 cm hypodensity is noted at the right thyroid lobe. The visualized lung apices are clear. Other: No additional soft tissue abnormalities are seen. IMPRESSION: 1. Suggestion of small 5 mm acute subdural hematoma overlying the left parietal lobe, new from the recent prior study. 2. No evidence of fracture or subluxation along the cervical spine. 3. Prominent soft tissue swelling overlying the right frontal and parietal calvarium, and surrounding the right orbit. Mild soft tissue air at the right side of the nose may reflect an underlying soft tissue laceration. 4. Blood noted partially filling the right maxillary sinus, new from the recent prior study. 5. Mild to moderate cortical volume loss and scattered small vessel ischemic microangiopathy. 6. Maxillary and  mandibular dental caries noted. 7. Mild degenerative change along the cervical spine. 8. **An incidental finding of potential clinical significance has been found. 3.4 cm hypodensity at the right thyroid lobe. Recommend further evaluation with thyroid ultrasound. If patient is clinically hyperthyroid, consider nuclear medicine thyroid uptake and scan.** Critical Value/emergent results were called by telephone at the time of interpretation on 12/02/2016 at 3:04 am to Dr. Glynn Octave, who verbally acknowledged these results. Electronically Signed   By: Roanna Raider M.D.   On: 12/02/2016 03:05   Ct Head Wo Contrast  Result Date: 12/01/2016 CLINICAL DATA:  Unwitnessed fall. Found sitting in shower with the hematoma along the left side of the head and left shoulder. Baseline dementia. EXAM: CT HEAD WITHOUT CONTRAST TECHNIQUE: Contiguous axial images were obtained from the base of the skull through the vertex without intravenous contrast. COMPARISON:  None. FINDINGS: Brain: Mild atrophy and white matter changes are evident. No acute infarct, hemorrhage, or mass lesion is present. Ventricles are of normal size. No  significant extra-axial collection is present. Vascular: Atherosclerotic calcifications are present within the cavernous internal carotid artery's bilaterally. No hyperdense vessel is present. Skull: A left frontal scalp hematoma is present. There is no underlying fracture. The calvarium is intact. No focal lytic or blastic lesions are present. Sinuses/Orbits: The paranasal sinuses and mastoid air cells are clear. The globes and orbits are within normal limits bilaterally. IMPRESSION: 1. Left frontal scalp hematoma without underlying fracture. 2. No acute intracranial abnormality. 3. Mild atrophy and white matter disease likely reflects the sequela of chronic microvascular ischemia. Electronically Signed   By: Marin Roberts M.D.   On: 12/01/2016 11:13   Ct Cervical Spine Wo Contrast  Result  Date: 12/02/2016 CLINICAL DATA:  Status post unwitnessed fall. Bruising about the right forehead and right orbit. Vomiting. Concern for head or cervical spine injury. Initial encounter. EXAM: CT HEAD WITHOUT CONTRAST CT MAXILLOFACIAL WITHOUT CONTRAST CT CERVICAL SPINE WITHOUT CONTRAST TECHNIQUE: Multidetector CT imaging of the head, cervical spine, and maxillofacial structures were performed using the standard protocol without intravenous contrast. Multiplanar CT image reconstructions of the cervical spine and maxillofacial structures were also generated. COMPARISON:  CT of the head performed 12/01/2016 FINDINGS: CT HEAD FINDINGS Brain: There is suggestion of a small 5 mm acute subdural hematoma overlying the left parietal lobe, new from the recent prior study. Prominence of the ventricles and sulci reflects mild to moderate cortical volume loss. Scattered periventricular and subcortical white matter change likely reflects small vessel ischemic microangiopathy. The brainstem and fourth ventricle are within normal limits. The basal ganglia are unremarkable in appearance. The cerebral hemispheres demonstrate grossly normal gray-white differentiation. No mass effect or midline shift is seen. Vascular: No hyperdense vessel or unexpected calcification. Skull: There is no evidence of fracture; visualized osseous structures are unremarkable in appearance. Other: Prominent soft tissue swelling is noted overlying the right frontal and parietal calvarium, and surrounding the right orbit. Mild soft tissue air at the right side of the nose may reflect an underlying soft tissue laceration. CT MAXILLOFACIAL FINDINGS Osseous: There is no evidence of fracture or dislocation. The maxilla and mandible appear intact. The nasal bone is unremarkable in appearance. Maxillary and mandibular dental caries are noted. Orbits: The orbits are intact bilaterally. Sinuses: Blood is noted partially filling the right maxillary sinus. The remaining  visualized paranasal sinuses and mastoid air cells are well-aerated. Soft tissues: Prominent soft tissue swelling noted overlying the right frontal and parietal calvarium, and surrounding the right orbit to. The parapharyngeal fat planes are preserved. The nasopharynx, oropharynx and hypopharynx are unremarkable in appearance. The visualized portions of the valleculae and piriform sinuses are grossly unremarkable. The parotid and submandibular glands are within normal limits. No cervical lymphadenopathy is seen. CT CERVICAL SPINE FINDINGS Alignment: Normal. Skull base and vertebrae: No acute fracture. No primary bone lesion or focal pathologic process. Soft tissues and spinal canal: No prevertebral fluid or swelling. No visible canal hematoma. Disc levels: Mild intervertebral disc space narrowing is noted at C3-C4 and at C6-C7. Underlying facet disease is noted at the mid cervical spine. Upper chest: A 3.4 cm hypodensity is noted at the right thyroid lobe. The visualized lung apices are clear. Other: No additional soft tissue abnormalities are seen. IMPRESSION: 1. Suggestion of small 5 mm acute subdural hematoma overlying the left parietal lobe, new from the recent prior study. 2. No evidence of fracture or subluxation along the cervical spine. 3. Prominent soft tissue swelling overlying the right frontal and parietal calvarium, and surrounding the  right orbit. Mild soft tissue air at the right side of the nose may reflect an underlying soft tissue laceration. 4. Blood noted partially filling the right maxillary sinus, new from the recent prior study. 5. Mild to moderate cortical volume loss and scattered small vessel ischemic microangiopathy. 6. Maxillary and mandibular dental caries noted. 7. Mild degenerative change along the cervical spine. 8. **An incidental finding of potential clinical significance has been found. 3.4 cm hypodensity at the right thyroid lobe. Recommend further evaluation with thyroid  ultrasound. If patient is clinically hyperthyroid, consider nuclear medicine thyroid uptake and scan.** Critical Value/emergent results were called by telephone at the time of interpretation on 12/02/2016 at 3:04 am to Dr. Glynn Octave, who verbally acknowledged these results. Electronically Signed   By: Roanna Raider M.D.   On: 12/02/2016 03:05   Dg Shoulder Left  Result Date: 12/02/2016 CLINICAL DATA:  Status post fall, with left shoulder bruising. Initial encounter. EXAM: LEFT SHOULDER - 2+ VIEW COMPARISON:  Left shoulder radiographs performed 12/01/2016 FINDINGS: There is no evidence of fracture or dislocation. The patient's left shoulder arthroplasty appears grossly intact, without evidence of loosening. Degenerative change is noted about the glenoid. Mild degenerative change is noted at the left acromioclavicular joint. No definite soft tissue abnormalities are characterized on radiograph. IMPRESSION: No evidence of fracture or dislocation. Left shoulder arthroplasty appears grossly intact, without evidence of loosening. Electronically Signed   By: Roanna Raider M.D.   On: 12/02/2016 02:07   Dg Shoulder Left  Result Date: 12/01/2016 CLINICAL DATA:  Fall. EXAM: LEFT SHOULDER - 2+ VIEW COMPARISON:  None. FINDINGS: Left shoulder hemi arthroplasty device is identified. The hardware components are in anatomic alignment. No periprosthetic fracture. Mild degenerative changes noted at the acromioclavicular joint. IMPRESSION: 1. No acute finding. 2. Previous left shoulder arthroplasty. Electronically Signed   By: Signa Kell M.D.   On: 12/01/2016 10:01   Dg Knee Complete 4 Views Left  Result Date: 12/02/2016 CLINICAL DATA:  Status post unwitnessed fall, with left knee bruising. Initial encounter. EXAM: LEFT KNEE - COMPLETE 4+ VIEW COMPARISON:  None. FINDINGS: There is no evidence of fracture or dislocation. The patient's total knee arthroplasty is grossly unremarkable in appearance, without evidence  of loosening. A small knee joint effusion noted. Mild diffuse soft tissue swelling is noted about the patella. Degenerative osseous fragments are noted about the distal quadriceps tendon. Scattered vascular calcifications are seen. IMPRESSION: 1. No evidence of fracture or dislocation. 2. Total knee arthroplasty is grossly unremarkable, without evidence of loosening. 3. Small knee joint effusion noted. 4. Scattered vascular calcifications seen. Electronically Signed   By: Roanna Raider M.D.   On: 12/02/2016 02:05   Dg Hand Complete Left  Result Date: 12/02/2016 CLINICAL DATA:  Left hand pain after a fall, hematoma EXAM: LEFT HAND - COMPLETE 3+ VIEW COMPARISON:  None. FINDINGS: Examination is limited by a pose a shin Ing. Irregular linear lucencies are visualized within the distal phalanx of the first digit at the proximal mid and distal aspect. No subluxation. Advanced arthritic changes at the first [facility] joint. Marked DIP and PIP joint arthritis with narrowing and osteophyte. IMPRESSION: 1. Multiple irregular lucencies within the proximal mid and distal aspect of the first distal phalanx, suspicious for nondisplaced fractures. 2. Marked arthritic change at the wrist and digits. Electronically Signed   By: Jasmine Pang M.D.   On: 12/02/2016 02:59   Ct Maxillofacial Wo Contrast  Result Date: 12/02/2016 CLINICAL DATA:  Status post unwitnessed fall.  Bruising about the right forehead and right orbit. Vomiting. Concern for head or cervical spine injury. Initial encounter. EXAM: CT HEAD WITHOUT CONTRAST CT MAXILLOFACIAL WITHOUT CONTRAST CT CERVICAL SPINE WITHOUT CONTRAST TECHNIQUE: Multidetector CT imaging of the head, cervical spine, and maxillofacial structures were performed using the standard protocol without intravenous contrast. Multiplanar CT image reconstructions of the cervical spine and maxillofacial structures were also generated. COMPARISON:  CT of the head performed 12/01/2016 FINDINGS: CT HEAD  FINDINGS Brain: There is suggestion of a small 5 mm acute subdural hematoma overlying the left parietal lobe, new from the recent prior study. Prominence of the ventricles and sulci reflects mild to moderate cortical volume loss. Scattered periventricular and subcortical white matter change likely reflects small vessel ischemic microangiopathy. The brainstem and fourth ventricle are within normal limits. The basal ganglia are unremarkable in appearance. The cerebral hemispheres demonstrate grossly normal gray-white differentiation. No mass effect or midline shift is seen. Vascular: No hyperdense vessel or unexpected calcification. Skull: There is no evidence of fracture; visualized osseous structures are unremarkable in appearance. Other: Prominent soft tissue swelling is noted overlying the right frontal and parietal calvarium, and surrounding the right orbit. Mild soft tissue air at the right side of the nose may reflect an underlying soft tissue laceration. CT MAXILLOFACIAL FINDINGS Osseous: There is no evidence of fracture or dislocation. The maxilla and mandible appear intact. The nasal bone is unremarkable in appearance. Maxillary and mandibular dental caries are noted. Orbits: The orbits are intact bilaterally. Sinuses: Blood is noted partially filling the right maxillary sinus. The remaining visualized paranasal sinuses and mastoid air cells are well-aerated. Soft tissues: Prominent soft tissue swelling noted overlying the right frontal and parietal calvarium, and surrounding the right orbit to. The parapharyngeal fat planes are preserved. The nasopharynx, oropharynx and hypopharynx are unremarkable in appearance. The visualized portions of the valleculae and piriform sinuses are grossly unremarkable. The parotid and submandibular glands are within normal limits. No cervical lymphadenopathy is seen. CT CERVICAL SPINE FINDINGS Alignment: Normal. Skull base and vertebrae: No acute fracture. No primary bone  lesion or focal pathologic process. Soft tissues and spinal canal: No prevertebral fluid or swelling. No visible canal hematoma. Disc levels: Mild intervertebral disc space narrowing is noted at C3-C4 and at C6-C7. Underlying facet disease is noted at the mid cervical spine. Upper chest: A 3.4 cm hypodensity is noted at the right thyroid lobe. The visualized lung apices are clear. Other: No additional soft tissue abnormalities are seen. IMPRESSION: 1. Suggestion of small 5 mm acute subdural hematoma overlying the left parietal lobe, new from the recent prior study. 2. No evidence of fracture or subluxation along the cervical spine. 3. Prominent soft tissue swelling overlying the right frontal and parietal calvarium, and surrounding the right orbit. Mild soft tissue air at the right side of the nose may reflect an underlying soft tissue laceration. 4. Blood noted partially filling the right maxillary sinus, new from the recent prior study. 5. Mild to moderate cortical volume loss and scattered small vessel ischemic microangiopathy. 6. Maxillary and mandibular dental caries noted. 7. Mild degenerative change along the cervical spine. 8. **An incidental finding of potential clinical significance has been found. 3.4 cm hypodensity at the right thyroid lobe. Recommend further evaluation with thyroid ultrasound. If patient is clinically hyperthyroid, consider nuclear medicine thyroid uptake and scan.** Critical Value/emergent results were called by telephone at the time of interpretation on 12/02/2016 at 3:04 am to Dr. Glynn Octave, who verbally acknowledged these results. Electronically  Signed   By: Roanna RaiderJeffery  Chang M.D.   On: 12/02/2016 03:05    Procedures Procedures (including critical care time)  Medications Ordered in ED Medications - No data to display   Initial Impression / Assessment and Plan / ED Course  I have reviewed the triage vital signs and the nursing notes.  Pertinent labs & imaging results  that were available during my care of the patient were reviewed by me and considered in my medical decision making (see chart for details).     Patient from nursing home after second fall today. Seen earlier today. He is nonverbal and not answering questions. Reportedly was answering questions earlier in the day.  He has ecchymosis and bruising to the right side of his face, left hand, L knee.  Small SDH d/w Dr. Franky Machoabbell.  He does not recommend surgical intervention.  Patient will now arouse to stimuli and say a few words. Does not follow commands consistently.   Labs unchanged from earlier today. UA negative.  Plan observation admission for neuro checks and repeat CT head. L thumb splinted. No answer at emergency contact.  D/w Dr Montez Moritaarter.  Final Clinical Impressions(s) / ED Diagnoses   Final diagnoses:  Subdural hematoma (HCC)  Fall, initial encounter  Nondisplaced fracture of distal phalanx of left thumb, initial encounter for closed fracture    New Prescriptions New Prescriptions   No medications on file   I personally performed the services described in this documentation, which was scribed in my presence. The recorded information has been reviewed and is accurate.     Glynn Octaveancour, Raylen Ken, MD 12/02/16 [phone]

## 2016-12-02 NOTE — ED Triage Notes (Addendum)
Per [facility], Pt from [facility]. Pt had unwitnessed fall, second fall today. Pt was seen here earlier. Pt has hx of dementia. Pt responds to voice by opening eyes, but wont verbally respond. Pt grabbing at Left knee. Pt has new bruises to R forehead and R eye per nursing home. Bruising to left shoulder and left knee, unsure if new or old bruises. Pt had several episodes of vomiting today per nursing home. EMS placed pt in c-collar and KED. VSS.

## 2016-12-02 NOTE — H&P (Signed)
History and Physical    Matthew Hancock ZOX:[id number] DOB: 07-15-1927 DOA: 12/02/2016  PCP: System, Provider Not In   Patient coming from: Memory care unit  Chief Complaint: Found down  HPI: Matthew Hancock is a 83 y.o. gentleman with a history of dementia (formally on medications for HTN and HLD but those have been stopped) who presents to the ED via EMS after being found down.  The patient was actually in the ED Saturday morning after having an unwitnessed fall in the shower in his memory care unit.  He was evaluated then, found to have bruising at multiple site on the left side of his body but his head CT and left shoulder xray were negative for acute findings at that time.  He was discharged.  Per his daughter, he was being checked on "hourly" once back at the memory unit.  He reportedly ate supper per routine.  However, around 11PM she was notified that he was found down again, and the patient was sent back to the ED.  He was found to have new bruising around his right eye.  He was still "drowsy" but responded to his name.  At the time of my initial exam the patient was sleeping.  Upon repeat examination this morning, he is awake but disoriented and asking for his wife Matthew Hancock.  He denies any headache.  When asked about pain, he points to both knees.  Unfortunately, he is extremely hard of hearing.  Per his daughter (interviwed by phone), the only recent medication change has been a transition from Zoloft to Celexa.  No known history of heart disease.  No known chest pain, shortness of breath, or palpitations.  No reported fevers.  No nausea or vomiting.  He has urinary frequency at baseline (attributed to BPH).  He complains of intermittent knee "weakness".  He walks with a cane at baseline.  Patient has been awake both times that he was found down.  Because falls were unwitnessed, it is unclear if there has been LOC.  ED Course: Repeat head CT shows 5mm SDH over the left parietal lobe, blood in the  maxillary sinus.  No specific comment on fracture.  He also has nondisplaced fractures involving his left thumb, which has been splinted in the ED.  Other imaging without acute abnormality.  Potassium 3.  WBC count 13.9.  EKG shows NSR with PACs and RBBB.  Chest xray negative for acute process.  Review of Systems: Limited due to history of dementia; disorientation.   Past Medical History:  Diagnosis Date  . Dementia   . Hypertension   BPH Constipation Formerly on medications for HTN and HLD; not anymore.  Past Surgical History: Bilateral knee and shoulder surgeries.  SOCIAL HISTORY: Never a smoker.  Stopped drinking one year ago.  No illicit drug use.  He is a widower.  He has five adult children.  No Known Allergies  FAMILY HISTORY: Patient's daughter denies any conditions that run in the family.  She reports that his sibling and parent lived into old age and were relatively healthy.  Prior to Admission medications   Medication Sig Start Date End Date Taking? Authorizing Provider  B Complex Vitamins (VITAMIN-B COMPLEX PO) Take 1 tablet by mouth daily.   Yes [provider]  Cholecalciferol (VITAMIN D) 2000 units CAPS Take 2,000 Units by mouth daily.   Yes [provider]  citalopram (CELEXA) 20 MG tablet Take 20 mg by mouth daily.   Yes [provider]  Magnesium  Chloride-Calcium 64-106 MG TBEC Take 1 tablet by mouth every Monday, Wednesday, and Friday.   Yes [provider]  Melatonin 10 MG CAPS Take 10 mg by mouth at bedtime.   Yes [provider]  memantine (NAMENDA) 10 MG tablet Take 10 mg by mouth 2 (two) times daily.   Yes [provider]  mineral oil liquid Place 2 mLs in ear(s) once a week. On Thursday   Yes [provider]  Multiple Vitamins-Minerals (MULTIVITAMIN WITH MINERALS) tablet Take 1 tablet by mouth daily.   Yes [provider]  polyethylene glycol (MIRALAX / GLYCOLAX) packet Take 17 g by  mouth every 3 (three) days.    Yes [provider]  potassium chloride SA (K-DUR,KLOR-CON) 20 MEQ tablet Take 20 mEq by mouth daily.   Yes [provider]  sertraline (ZOLOFT) 25 MG tablet Take 25 mg by mouth daily. Once daily for 2 weeks. Then d/c   Yes [provider]    Physical Exam: Vitals:   12/02/16 0430 12/02/16 0445 12/02/16 0500 12/02/16 0515  BP: (!) 145/82  (!) 151/72 (!) 143/82  Pulse: 60 (!) 54 (!) 59 62  Resp:    18  Temp:      TempSrc:      SpO2: 100% 94% 98% 97%      Constitutional: NAD, calm, appears comfortable, extremely hard of hearing (seems to do better with the right ear) Vitals:   12/02/16 0430 12/02/16 0445 12/02/16 0500 12/02/16 0515  BP: (!) 145/82  (!) 151/72 (!) 143/82  Pulse: 60 (!) 54 (!) 59 62  Resp:    18  Temp:      TempSrc:      SpO2: 100% 94% 98% 97%   Eyes: pupils pinpoint, sluggish.  Lids and conjunctivae normal ENMT: Mucous membranes are very dry.  Posterior pharynx not completely visualized.  He appears to be wearing dentures.    Neck: normal appearance, supple, no masses Respiratory: clear to auscultation bilaterally, no wheezing, no crackles. Normal respiratory effort. No accessory muscle use.  Cardiovascular: Irregular but rate controlled.  No murmurs / rubs / gallops. No extremity edema. 2+ pedal pulses. GI: abdomen is soft and compressible.  No distention.  No tenderness.  Bowel sounds are present. Musculoskeletal:  No joint deformity in upper and lower extremities. Moves all four extremities spontaneously.  ROM reduced in left arm; left thumb is splinted.  No contractures. Normal muscle tone.  Skin: no rashes, pale, cool, dry, several bruises noted.  Hematoma to right side of the face and right periorbital region. Neurologic: No apparent focal deficits.  Follows simple commands consistently.  Psychiatric: Oriented to person.  Can be redirected to recall that he is in the hospital.  Otherwise disoriented and  lacks judgment/insight into medical condition.      Labs on Admission: I have personally reviewed following labs and imaging studies  CBC:  Recent Labs Lab 12/01/16 1007 12/02/16 0041  WBC 8.6 13.9*  NEUTROABS 6.5 12.2*  HGB 11.7* 11.0*  HCT 36.6* 34.9*  MCV 89.5 89.3  PLT 185 191   Basic Metabolic Panel:  Recent Labs Lab 12/01/16 1007 12/02/16 0041  NA 140 138  K 3.7 3.0*  CL 102 101  CO2 29 27  GLUCOSE 93 132*  BUN 16 16  CREATININE 0.71 0.80  CALCIUM 8.7* 8.6*   GFR: CrCl cannot be calculated (Unknown ideal weight.).   Liver Function Tests:  Recent Labs Lab 12/01/16 1007  AST 26  ALT 23  ALKPHOS 61  BILITOT 0.4  PROT 6.7  ALBUMIN 4.1   Cardiac Enzymes:  Recent Labs Lab 12/02/16 0041  TROPONINI <0.03   Urine analysis:    Component Value Date/Time   COLORURINE STRAW (A) 12/01/2016 0935   APPEARANCEUR CLEAR 12/01/2016 0935   LABSPEC 1.009 12/01/2016 0935   PHURINE 8.0 12/01/2016 0935   GLUCOSEU NEGATIVE 12/01/2016 0935   HGBUR NEGATIVE 12/01/2016 0935   BILIRUBINUR NEGATIVE 12/01/2016 0935   KETONESUR NEGATIVE 12/01/2016 0935   PROTEINUR NEGATIVE 12/01/2016 0935   NITRITE NEGATIVE 12/01/2016 0935   LEUKOCYTESUR NEGATIVE 12/01/2016 0935    Radiological Exams on Admission: Dg Chest 2 View  Result Date: 12/02/2016 CLINICAL DATA:  Status post unwitnessed fall. Concern for chest injury. Initial encounter. EXAM: CHEST  2 VIEW COMPARISON:  None. FINDINGS: The lungs are well-aerated and clear. There is no evidence of focal opacification, pleural effusion or pneumothorax. The heart is borderline normal in size. No acute osseous abnormalities are seen. Anterior bridging osteophytes are seen along the thoracic spine. The patient's right shoulder arthroplasty is only minimally imaged. IMPRESSION: No acute cardiopulmonary process seen. No displaced rib fracture seen. Electronically Signed   By: Roanna Raider M.D.   On: 12/02/2016 02:04   Ct Head Wo  Contrast  Result Date: 12/02/2016 CLINICAL DATA:  Status post unwitnessed fall. Bruising about the right forehead and right orbit. Vomiting. Concern for head or cervical spine injury. Initial encounter. EXAM: CT HEAD WITHOUT CONTRAST CT MAXILLOFACIAL WITHOUT CONTRAST CT CERVICAL SPINE WITHOUT CONTRAST TECHNIQUE: Multidetector CT imaging of the head, cervical spine, and maxillofacial structures were performed using the standard protocol without intravenous contrast. Multiplanar CT image reconstructions of the cervical spine and maxillofacial structures were also generated. COMPARISON:  CT of the head performed 12/01/2016 FINDINGS: CT HEAD FINDINGS Brain: There is suggestion of a small 5 mm acute subdural hematoma overlying the left parietal lobe, new from the recent prior study. Prominence of the ventricles and sulci reflects mild to moderate cortical volume loss. Scattered periventricular and subcortical white matter change likely reflects small vessel ischemic microangiopathy. The brainstem and fourth ventricle are within normal limits. The basal ganglia are unremarkable in appearance. The cerebral hemispheres demonstrate grossly normal gray-white differentiation. No mass effect or midline shift is seen. Vascular: No hyperdense vessel or unexpected calcification. Skull: There is no evidence of fracture; visualized osseous structures are unremarkable in appearance. Other: Prominent soft tissue swelling is noted overlying the right frontal and parietal calvarium, and surrounding the right orbit. Mild soft tissue air at the right side of the nose may reflect an underlying soft tissue laceration. CT MAXILLOFACIAL FINDINGS Osseous: There is no evidence of fracture or dislocation. The maxilla and mandible appear intact. The nasal bone is unremarkable in appearance. Maxillary and mandibular dental caries are noted. Orbits: The orbits are intact bilaterally. Sinuses: Blood is noted partially filling the right maxillary  sinus. The remaining visualized paranasal sinuses and mastoid air cells are well-aerated. Soft tissues: Prominent soft tissue swelling noted overlying the right frontal and parietal calvarium, and surrounding the right orbit to. The parapharyngeal fat planes are preserved. The nasopharynx, oropharynx and hypopharynx are unremarkable in appearance. The visualized portions of the valleculae and piriform sinuses are grossly unremarkable. The parotid and submandibular glands are within normal limits. No cervical lymphadenopathy is seen. CT CERVICAL SPINE FINDINGS Alignment: Normal. Skull base and vertebrae: No acute fracture. No primary bone lesion or focal pathologic process. Soft tissues and spinal canal: No prevertebral fluid  or swelling. No visible canal hematoma. Disc levels: Mild intervertebral disc space narrowing is noted at C3-C4 and at C6-C7. Underlying facet disease is noted at the mid cervical spine. Upper chest: A 3.4 cm hypodensity is noted at the right thyroid lobe. The visualized lung apices are clear. Other: No additional soft tissue abnormalities are seen. IMPRESSION: 1. Suggestion of small 5 mm acute subdural hematoma overlying the left parietal lobe, new from the recent prior study. 2. No evidence of fracture or subluxation along the cervical spine. 3. Prominent soft tissue swelling overlying the right frontal and parietal calvarium, and surrounding the right orbit. Mild soft tissue air at the right side of the nose may reflect an underlying soft tissue laceration. 4. Blood noted partially filling the right maxillary sinus, new from the recent prior study. 5. Mild to moderate cortical volume loss and scattered small vessel ischemic microangiopathy. 6. Maxillary and mandibular dental caries noted. 7. Mild degenerative change along the cervical spine. 8. **An incidental finding of potential clinical significance has been found. 3.4 cm hypodensity at the right thyroid lobe. Recommend further evaluation  with thyroid ultrasound. If patient is clinically hyperthyroid, consider nuclear medicine thyroid uptake and scan.** Critical Value/emergent results were called by telephone at the time of interpretation on 12/02/2016 at 3:04 am to Dr. Glynn Octave, who verbally acknowledged these results. Electronically Signed   By: Roanna Raider M.D.   On: 12/02/2016 03:05   Ct Head Wo Contrast  Result Date: 12/01/2016 CLINICAL DATA:  Unwitnessed fall. Found sitting in shower with the hematoma along the left side of the head and left shoulder. Baseline dementia. EXAM: CT HEAD WITHOUT CONTRAST TECHNIQUE: Contiguous axial images were obtained from the base of the skull through the vertex without intravenous contrast. COMPARISON:  None. FINDINGS: Brain: Mild atrophy and white matter changes are evident. No acute infarct, hemorrhage, or mass lesion is present. Ventricles are of normal size. No significant extra-axial collection is present. Vascular: Atherosclerotic calcifications are present within the cavernous internal carotid artery's bilaterally. No hyperdense vessel is present. Skull: A left frontal scalp hematoma is present. There is no underlying fracture. The calvarium is intact. No focal lytic or blastic lesions are present. Sinuses/Orbits: The paranasal sinuses and mastoid air cells are clear. The globes and orbits are within normal limits bilaterally. IMPRESSION: 1. Left frontal scalp hematoma without underlying fracture. 2. No acute intracranial abnormality. 3. Mild atrophy and white matter disease likely reflects the sequela of chronic microvascular ischemia. Electronically Signed   By: Marin Roberts M.D.   On: 12/01/2016 11:13   Ct Cervical Spine Wo Contrast  Result Date: 12/02/2016 CLINICAL DATA:  Status post unwitnessed fall. Bruising about the right forehead and right orbit. Vomiting. Concern for head or cervical spine injury. Initial encounter. EXAM: CT HEAD WITHOUT CONTRAST CT MAXILLOFACIAL WITHOUT  CONTRAST CT CERVICAL SPINE WITHOUT CONTRAST TECHNIQUE: Multidetector CT imaging of the head, cervical spine, and maxillofacial structures were performed using the standard protocol without intravenous contrast. Multiplanar CT image reconstructions of the cervical spine and maxillofacial structures were also generated. COMPARISON:  CT of the head performed 12/01/2016 FINDINGS: CT HEAD FINDINGS Brain: There is suggestion of a small 5 mm acute subdural hematoma overlying the left parietal lobe, new from the recent prior study. Prominence of the ventricles and sulci reflects mild to moderate cortical volume loss. Scattered periventricular and subcortical white matter change likely reflects small vessel ischemic microangiopathy. The brainstem and fourth ventricle are within normal limits. The basal ganglia are unremarkable  in appearance. The cerebral hemispheres demonstrate grossly normal gray-white differentiation. No mass effect or midline shift is seen. Vascular: No hyperdense vessel or unexpected calcification. Skull: There is no evidence of fracture; visualized osseous structures are unremarkable in appearance. Other: Prominent soft tissue swelling is noted overlying the right frontal and parietal calvarium, and surrounding the right orbit. Mild soft tissue air at the right side of the nose may reflect an underlying soft tissue laceration. CT MAXILLOFACIAL FINDINGS Osseous: There is no evidence of fracture or dislocation. The maxilla and mandible appear intact. The nasal bone is unremarkable in appearance. Maxillary and mandibular dental caries are noted. Orbits: The orbits are intact bilaterally. Sinuses: Blood is noted partially filling the right maxillary sinus. The remaining visualized paranasal sinuses and mastoid air cells are well-aerated. Soft tissues: Prominent soft tissue swelling noted overlying the right frontal and parietal calvarium, and surrounding the right orbit to. The parapharyngeal fat planes are  preserved. The nasopharynx, oropharynx and hypopharynx are unremarkable in appearance. The visualized portions of the valleculae and piriform sinuses are grossly unremarkable. The parotid and submandibular glands are within normal limits. No cervical lymphadenopathy is seen. CT CERVICAL SPINE FINDINGS Alignment: Normal. Skull base and vertebrae: No acute fracture. No primary bone lesion or focal pathologic process. Soft tissues and spinal canal: No prevertebral fluid or swelling. No visible canal hematoma. Disc levels: Mild intervertebral disc space narrowing is noted at C3-C4 and at C6-C7. Underlying facet disease is noted at the mid cervical spine. Upper chest: A 3.4 cm hypodensity is noted at the right thyroid lobe. The visualized lung apices are clear. Other: No additional soft tissue abnormalities are seen. IMPRESSION: 1. Suggestion of small 5 mm acute subdural hematoma overlying the left parietal lobe, new from the recent prior study. 2. No evidence of fracture or subluxation along the cervical spine. 3. Prominent soft tissue swelling overlying the right frontal and parietal calvarium, and surrounding the right orbit. Mild soft tissue air at the right side of the nose may reflect an underlying soft tissue laceration. 4. Blood noted partially filling the right maxillary sinus, new from the recent prior study. 5. Mild to moderate cortical volume loss and scattered small vessel ischemic microangiopathy. 6. Maxillary and mandibular dental caries noted. 7. Mild degenerative change along the cervical spine. 8. **An incidental finding of potential clinical significance has been found. 3.4 cm hypodensity at the right thyroid lobe. Recommend further evaluation with thyroid ultrasound. If patient is clinically hyperthyroid, consider nuclear medicine thyroid uptake and scan.** Critical Value/emergent results were called by telephone at the time of interpretation on 12/02/2016 at 3:04 am to Dr. Glynn Octave, who  verbally acknowledged these results. Electronically Signed   By: Roanna Raider M.D.   On: 12/02/2016 03:05   Dg Shoulder Left  Result Date: 12/02/2016 CLINICAL DATA:  Status post fall, with left shoulder bruising. Initial encounter. EXAM: LEFT SHOULDER - 2+ VIEW COMPARISON:  Left shoulder radiographs performed 12/01/2016 FINDINGS: There is no evidence of fracture or dislocation. The patient's left shoulder arthroplasty appears grossly intact, without evidence of loosening. Degenerative change is noted about the glenoid. Mild degenerative change is noted at the left acromioclavicular joint. No definite soft tissue abnormalities are characterized on radiograph. IMPRESSION: No evidence of fracture or dislocation. Left shoulder arthroplasty appears grossly intact, without evidence of loosening. Electronically Signed   By: Roanna Raider M.D.   On: 12/02/2016 02:07   Dg Shoulder Left  Result Date: 12/01/2016 CLINICAL DATA:  Fall. EXAM: LEFT SHOULDER -  2+ VIEW COMPARISON:  None. FINDINGS: Left shoulder hemi arthroplasty device is identified. The hardware components are in anatomic alignment. No periprosthetic fracture. Mild degenerative changes noted at the acromioclavicular joint. IMPRESSION: 1. No acute finding. 2. Previous left shoulder arthroplasty. Electronically Signed   By: Signa Kell M.D.   On: 12/01/2016 10:01   Dg Knee Complete 4 Views Left  Result Date: 12/02/2016 CLINICAL DATA:  Status post unwitnessed fall, with left knee bruising. Initial encounter. EXAM: LEFT KNEE - COMPLETE 4+ VIEW COMPARISON:  None. FINDINGS: There is no evidence of fracture or dislocation. The patient's total knee arthroplasty is grossly unremarkable in appearance, without evidence of loosening. A small knee joint effusion noted. Mild diffuse soft tissue swelling is noted about the patella. Degenerative osseous fragments are noted about the distal quadriceps tendon. Scattered vascular calcifications are seen. IMPRESSION:  1. No evidence of fracture or dislocation. 2. Total knee arthroplasty is grossly unremarkable, without evidence of loosening. 3. Small knee joint effusion noted. 4. Scattered vascular calcifications seen. Electronically Signed   By: Roanna Raider M.D.   On: 12/02/2016 02:05   Dg Hand Complete Left  Result Date: 12/02/2016 CLINICAL DATA:  Left hand pain after a fall, hematoma EXAM: LEFT HAND - COMPLETE 3+ VIEW COMPARISON:  None. FINDINGS: Examination is limited by a pose a shin Ing. Irregular linear lucencies are visualized within the distal phalanx of the first digit at the proximal mid and distal aspect. No subluxation. Advanced arthritic changes at the first [facility] joint. Marked DIP and PIP joint arthritis with narrowing and osteophyte. IMPRESSION: 1. Multiple irregular lucencies within the proximal mid and distal aspect of the first distal phalanx, suspicious for nondisplaced fractures. 2. Marked arthritic change at the wrist and digits. Electronically Signed   By: Jasmine Pang M.D.   On: 12/02/2016 02:59   Ct Maxillofacial Wo Contrast  Result Date: 12/02/2016 CLINICAL DATA:  Status post unwitnessed fall. Bruising about the right forehead and right orbit. Vomiting. Concern for head or cervical spine injury. Initial encounter. EXAM: CT HEAD WITHOUT CONTRAST CT MAXILLOFACIAL WITHOUT CONTRAST CT CERVICAL SPINE WITHOUT CONTRAST TECHNIQUE: Multidetector CT imaging of the head, cervical spine, and maxillofacial structures were performed using the standard protocol without intravenous contrast. Multiplanar CT image reconstructions of the cervical spine and maxillofacial structures were also generated. COMPARISON:  CT of the head performed 12/01/2016 FINDINGS: CT HEAD FINDINGS Brain: There is suggestion of a small 5 mm acute subdural hematoma overlying the left parietal lobe, new from the recent prior study. Prominence of the ventricles and sulci reflects mild to moderate cortical volume loss. Scattered  periventricular and subcortical white matter change likely reflects small vessel ischemic microangiopathy. The brainstem and fourth ventricle are within normal limits. The basal ganglia are unremarkable in appearance. The cerebral hemispheres demonstrate grossly normal gray-white differentiation. No mass effect or midline shift is seen. Vascular: No hyperdense vessel or unexpected calcification. Skull: There is no evidence of fracture; visualized osseous structures are unremarkable in appearance. Other: Prominent soft tissue swelling is noted overlying the right frontal and parietal calvarium, and surrounding the right orbit. Mild soft tissue air at the right side of the nose may reflect an underlying soft tissue laceration. CT MAXILLOFACIAL FINDINGS Osseous: There is no evidence of fracture or dislocation. The maxilla and mandible appear intact. The nasal bone is unremarkable in appearance. Maxillary and mandibular dental caries are noted. Orbits: The orbits are intact bilaterally. Sinuses: Blood is noted partially filling the right maxillary sinus. The remaining visualized  paranasal sinuses and mastoid air cells are well-aerated. Soft tissues: Prominent soft tissue swelling noted overlying the right frontal and parietal calvarium, and surrounding the right orbit to. The parapharyngeal fat planes are preserved. The nasopharynx, oropharynx and hypopharynx are unremarkable in appearance. The visualized portions of the valleculae and piriform sinuses are grossly unremarkable. The parotid and submandibular glands are within normal limits. No cervical lymphadenopathy is seen. CT CERVICAL SPINE FINDINGS Alignment: Normal. Skull base and vertebrae: No acute fracture. No primary bone lesion or focal pathologic process. Soft tissues and spinal canal: No prevertebral fluid or swelling. No visible canal hematoma. Disc levels: Mild intervertebral disc space narrowing is noted at C3-C4 and at C6-C7. Underlying facet disease is  noted at the mid cervical spine. Upper chest: A 3.4 cm hypodensity is noted at the right thyroid lobe. The visualized lung apices are clear. Other: No additional soft tissue abnormalities are seen. IMPRESSION: 1. Suggestion of small 5 mm acute subdural hematoma overlying the left parietal lobe, new from the recent prior study. 2. No evidence of fracture or subluxation along the cervical spine. 3. Prominent soft tissue swelling overlying the right frontal and parietal calvarium, and surrounding the right orbit. Mild soft tissue air at the right side of the nose may reflect an underlying soft tissue laceration. 4. Blood noted partially filling the right maxillary sinus, new from the recent prior study. 5. Mild to moderate cortical volume loss and scattered small vessel ischemic microangiopathy. 6. Maxillary and mandibular dental caries noted. 7. Mild degenerative change along the cervical spine. 8. **An incidental finding of potential clinical significance has been found. 3.4 cm hypodensity at the right thyroid lobe. Recommend further evaluation with thyroid ultrasound. If patient is clinically hyperthyroid, consider nuclear medicine thyroid uptake and scan.** Critical Value/emergent results were called by telephone at the time of interpretation on 12/02/2016 at 3:04 am to Dr. Glynn Octave, who verbally acknowledged these results. Electronically Signed   By: Roanna Raider M.D.   On: 12/02/2016 03:05    EKG: Independently reviewed. Noted above.  Assessment/Plan Principal Problem:   Subdural hematoma (HCC) Active Problems:   Abnormal CT scan of head   Fall at home, initial encounter   Dementia   Leukocytosis   Hypokalemia   BPH (benign prostatic hyperplasia)   Constipation      Unwitnessed falls, recurrent, now with 5mm subdural hematoma over the left parietal lobe. --Place in stepdown unit --Neurochecks q2h x 4 then q4h --Repeat head CT in 24 hours, sooner if the patient develops mental status  changes --Neurosurgery contacted from the ED about this patient --Fall precautions --Check orthostatics --Avoid blood thinners  Maxillary bleeding --Likely secondary to face trauma from fall.  Dr Ulice Bold of ENT to review films and give feedback.  Nondisplaced left thumb fracture --Splinted in the ED --Anticipate outpatient follow-up will be needed.  Will need to touch base with ortho hand on call to confirm  Dementia --Namenda  Depression --Transitioning from Zoloft to Celexa  Hypokalemia --Replacement ordered  Leukocytosis, likely acute phase reactant --Of note, U/A yesterday did not show infection.   DVT prophylaxis: TED Hose, SCDs Code Status: FULL Family Communication: Spoke to daughter by phone x 2 this AM. Disposition Plan: To be determined. Consults called: Neurosurgery called in the ED but will need to be called again if formal consult needed.  I called Dr. Ulice Bold (ENT-Trauma) and she will review CT face and give feedback.  Will need hand follow-up as well. Admission status: Place in observation,  telemetry monitoring.  Stepdown unit for frequent neurochecks.   TIME SPENT: 70 minutes   Jerene Bearsarter,Violet Cart Harrison MD [facility] Pager [phone]  If 7PM-7AM, please contact night-coverage [email or web address] Password [email or web address]  12/02/2016, 7:02 AM

## 2016-12-02 NOTE — ED Notes (Signed)
Patient transported to X-ray 

## 2016-12-02 NOTE — ED Notes (Signed)
Pt watch placed in a urine cup due to splint placed on pt lt arm

## 2016-12-02 NOTE — ED Notes (Signed)
Patient declined breakfast at this time.  He asks to be left to sleep.

## 2016-12-02 NOTE — ED Notes (Signed)
Ordered reg tray w thin liquid

## 2016-12-02 NOTE — ED Notes (Signed)
Attempted to call report

## 2016-12-03 ENCOUNTER — Observation Stay (HOSPITAL_COMMUNITY): Payer: Medicare Other

## 2016-12-03 ENCOUNTER — Encounter (HOSPITAL_COMMUNITY): Payer: Self-pay | Admitting: *Deleted

## 2016-12-03 DIAGNOSIS — Y93E1 Activity, personal bathing and showering: Secondary | ICD-10-CM | POA: Diagnosis not present

## 2016-12-03 DIAGNOSIS — I6200 Nontraumatic subdural hemorrhage, unspecified: Secondary | ICD-10-CM | POA: Diagnosis present

## 2016-12-03 DIAGNOSIS — E785 Hyperlipidemia, unspecified: Secondary | ICD-10-CM | POA: Diagnosis present

## 2016-12-03 DIAGNOSIS — W182XXA Fall in (into) shower or empty bathtub, initial encounter: Secondary | ICD-10-CM | POA: Diagnosis present

## 2016-12-03 DIAGNOSIS — K5900 Constipation, unspecified: Secondary | ICD-10-CM | POA: Diagnosis present

## 2016-12-03 DIAGNOSIS — R930 Abnormal findings on diagnostic imaging of skull and head, not elsewhere classified: Secondary | ICD-10-CM | POA: Diagnosis not present

## 2016-12-03 DIAGNOSIS — R9431 Abnormal electrocardiogram [ECG] [EKG]: Secondary | ICD-10-CM

## 2016-12-03 DIAGNOSIS — H9190 Unspecified hearing loss, unspecified ear: Secondary | ICD-10-CM | POA: Diagnosis present

## 2016-12-03 DIAGNOSIS — F329 Major depressive disorder, single episode, unspecified: Secondary | ICD-10-CM | POA: Diagnosis present

## 2016-12-03 DIAGNOSIS — R410 Disorientation, unspecified: Secondary | ICD-10-CM

## 2016-12-03 DIAGNOSIS — Y92121 Bathroom in nursing home as the place of occurrence of the external cause: Secondary | ICD-10-CM | POA: Diagnosis not present

## 2016-12-03 DIAGNOSIS — L8990 Pressure ulcer of unspecified site, unspecified stage: Secondary | ICD-10-CM | POA: Diagnosis present

## 2016-12-03 DIAGNOSIS — F05 Delirium due to known physiological condition: Secondary | ICD-10-CM | POA: Diagnosis present

## 2016-12-03 DIAGNOSIS — E876 Hypokalemia: Secondary | ICD-10-CM

## 2016-12-03 DIAGNOSIS — D72829 Elevated white blood cell count, unspecified: Secondary | ICD-10-CM | POA: Diagnosis present

## 2016-12-03 DIAGNOSIS — W19XXXA Unspecified fall, initial encounter: Secondary | ICD-10-CM | POA: Diagnosis not present

## 2016-12-03 DIAGNOSIS — Z79899 Other long term (current) drug therapy: Secondary | ICD-10-CM | POA: Diagnosis not present

## 2016-12-03 DIAGNOSIS — R350 Frequency of micturition: Secondary | ICD-10-CM | POA: Diagnosis present

## 2016-12-03 DIAGNOSIS — F0390 Unspecified dementia without behavioral disturbance: Secondary | ICD-10-CM | POA: Diagnosis present

## 2016-12-03 DIAGNOSIS — S0011XA Contusion of right eyelid and periocular area, initial encounter: Secondary | ICD-10-CM | POA: Diagnosis present

## 2016-12-03 DIAGNOSIS — S62525A Nondisplaced fracture of distal phalanx of left thumb, initial encounter for closed fracture: Secondary | ICD-10-CM | POA: Diagnosis present

## 2016-12-03 DIAGNOSIS — I4581 Long QT syndrome: Secondary | ICD-10-CM | POA: Diagnosis present

## 2016-12-03 DIAGNOSIS — S065X9A Traumatic subdural hemorrhage with loss of consciousness of unspecified duration, initial encounter: Secondary | ICD-10-CM | POA: Diagnosis present

## 2016-12-03 DIAGNOSIS — N401 Enlarged prostate with lower urinary tract symptoms: Secondary | ICD-10-CM | POA: Diagnosis present

## 2016-12-03 DIAGNOSIS — I4510 Unspecified right bundle-branch block: Secondary | ICD-10-CM | POA: Diagnosis present

## 2016-12-03 DIAGNOSIS — Y92009 Unspecified place in unspecified non-institutional (private) residence as the place of occurrence of the external cause: Secondary | ICD-10-CM | POA: Diagnosis not present

## 2016-12-03 DIAGNOSIS — I10 Essential (primary) hypertension: Secondary | ICD-10-CM | POA: Diagnosis present

## 2016-12-03 LAB — CBC
HEMATOCRIT: 37.1 % — AB (ref 39.0–52.0)
HEMOGLOBIN: 12.1 g/dL — AB (ref 13.0–17.0)
MCH: 28.8 pg (ref 26.0–34.0)
MCHC: 32.6 g/dL (ref 30.0–36.0)
MCV: 88.3 fL (ref 78.0–100.0)
Platelets: 191 10*3/uL (ref 150–400)
RBC: 4.2 MIL/uL — ABNORMAL LOW (ref 4.22–5.81)
RDW: 13.5 % (ref 11.5–15.5)
WBC: 11 10*3/uL — AB (ref 4.0–10.5)

## 2016-12-03 LAB — BASIC METABOLIC PANEL
ANION GAP: 11 (ref 5–15)
ANION GAP: 9 (ref 5–15)
BUN: 11 mg/dL (ref 6–20)
BUN: 11 mg/dL (ref 6–20)
CALCIUM: 8.5 mg/dL — AB (ref 8.9–10.3)
CALCIUM: 8.5 mg/dL — AB (ref 8.9–10.3)
CO2: 24 mmol/L (ref 22–32)
CO2: 28 mmol/L (ref 22–32)
CREATININE: 0.69 mg/dL (ref 0.61–1.24)
Chloride: 102 mmol/L (ref 101–111)
Chloride: 101 mmol/L (ref 101–111)
Creatinine, Ser: 0.61 mg/dL (ref 0.61–1.24)
GFR calc Af Amer: >60 mL/min (ref >60)
GFR calc non Af Amer: >60 mL/min (ref >60)
GFR calc non Af Amer: >60 mL/min (ref >60)
GLUCOSE: 112 mg/dL — AB (ref 65–99)
Glucose, Bld: 100 mg/dL — ABNORMAL HIGH (ref 65–99)
Potassium: 3.1 mmol/L — ABNORMAL LOW (ref 3.5–5.1)
Potassium: 2.9 mmol/L — ABNORMAL LOW (ref 3.5–5.1)
Sodium: 137 mmol/L (ref 135–145)
Sodium: 138 mmol/L (ref 135–145)

## 2016-12-03 LAB — GLUCOSE, CAPILLARY: GLUCOSE-CAPILLARY: 93 mg/dL (ref 65–99)

## 2016-12-03 LAB — MAGNESIUM: MAGNESIUM: 1.9 mg/dL (ref 1.7–2.4)

## 2016-12-03 LAB — MRSA PCR SCREENING: MRSA BY PCR: NEGATIVE

## 2016-12-03 MED ORDER — POTASSIUM CHLORIDE CRYS ER 20 MEQ PO TBCR
40.0000 meq | EXTENDED_RELEASE_TABLET | Freq: Once | ORAL | Status: AC
Start: 2016-12-03 — End: 2016-12-03
  Administered 2016-12-03: 40 meq via ORAL
  Filled 2016-12-03: qty 2

## 2016-12-03 MED ORDER — ORAL CARE MOUTH RINSE
15.0000 mL | Freq: Two times a day (BID) | OROMUCOSAL | Status: DC
  Administered 2016-12-04: 15 mL via OROMUCOSAL

## 2016-12-03 MED ORDER — LEVETIRACETAM 500 MG PO TABS
500.0000 mg | ORAL_TABLET | Freq: Two times a day (BID) | ORAL | Status: DC
  Administered 2016-12-03 – 2016-12-04 (×2): 500 mg via ORAL
  Filled 2016-12-03 (×3): qty 1

## 2016-12-03 MED ORDER — POTASSIUM CHLORIDE CRYS ER 20 MEQ PO TBCR
40.0000 meq | EXTENDED_RELEASE_TABLET | Freq: Every day | ORAL | Status: DC
  Administered 2016-12-04: 40 meq via ORAL
  Filled 2016-12-03: qty 2

## 2016-12-03 NOTE — Care Management Note (Addendum)
Case Management Note  Patient Details  Name: Madlyn FrankelJack Garraway MRN: [id number] Date of Birth: 10/30/1927  Subjective/Objective:    [facility], presents with L SDH, repeat head CT pending, he is confused, has dementia, and agitatied, Neurosurgery was consulted - no surgery planned and plastics consulted.  CSW referral, He has [employer] insurance.  Patient is for dc today, back to [facility] memory care, pt to see patient.  NCM spoke with daughter, she states [facility] and someone will be there today from 7pm to 7 am.  Also patient has a rollator which daughter states is old. Awaiting pt recs.                 Action/Plan: NCM will follow along with CSW for dc needs.  Expected Discharge Date:                  Expected Discharge Plan:     In-House Referral:     Discharge planning Services  CM Consult  Post Acute Care Choice:    Choice offered to:     DME Arranged:    DME Agency:     HH Arranged:    HH Agency:     Status of Service:  In process, will continue to follow  If discussed at Long Length of Stay Meetings, dates discussed:    Additional Comments:  Leone Havenaylor, Nalee Lightle Clinton, RN 12/03/2016, 10:23 AM

## 2016-12-03 NOTE — Progress Notes (Addendum)
PROGRESS NOTE                                                                                                                                                                                                             Patient Demographics:    Matthew Hancock, is a 83 y.o. male, DOB - 1927-08-16, WUJ:[id number]  Admit date - 12/02/2016   Admitting Physician Eddie North, MD  Outpatient Primary MD for the patient is System, Provider Not In  LOS - 0  Outpatient Specialists: none  Chief Complaint  Patient presents with  . Fall       Brief Narrative   83 year old male with severe dementia, resident of a memory care unit brought by EMS after found down on the floor. He had an unwitnessed fall while in the shower on the morning of admission, brought to the ED and found to have multiple bruising on the left side of the body. Head CT and left shoulder x-ray were negative and patient was discharged back to the facility. His daughter was checking him frequently at the memory care unit. Patient had his supper but was found on the floor again around 11 PM and sent back to the ED. He was found to have new bruising around his right eye and drowsy. No witnessed seizures, nausea, vomiting, fever, chills, bowel or urinary incontinence. Repeat head CT in the ED showed 5 mm subdural hematoma over the left parietal lobe  and blood in the maxillary sinus. Also had a nondisplaced fracture involving his left thumb. Vitals in the ED was stable. Blood work showed WBC of 13.9 K, potassium of 3. EKG with normal sinus rhythm, PACs and RBBB. Chest x-ray unremarkable. Neurosurgeon consulted by ED physician who recommended no surgical intervention and monitor. Trauma surgery was consulted and hospitalist admission requested to stepdown unit.    Subjective:   Patient has episodes of agitation trying to get out of bed.   Assessment  & Plan :    Principal  Problem:   Subdural hematoma (HCC) Secondary to mechanical fall. Continue step down monitoring.. Neuro checks per unit protocol. Avoid narcotics.  Repeat CT head shows slightly increase in size of left-sided subdural hematoma (head CT today also commence on 5 mm right-sided SDH and unchanged maxillary sinus fluid) -Consulted neurosurgery.  Active Problems: Severe dementia with delirium As  per daughter at bedside he is currently at baseline mental status. Continue Editor, commissioning at bedside. Hold off on home Celexa and Zoloft due to prolonged QTC. Cannot use Haldol or Seroquel due to same reason..  continue low-dose Ativan for now. Continue Recruitment consultant.     Leukocytosis Possibly reactive due to acute trauma. No signs of infection. Improving  Maxillary sinus hemorrhage. Secondary to acute trauma. Seen by trauma surgery and recommends no signs of fracture. no surgical intervention needed. Keep head of bed elevated, no  nose blowing and ice to face to reduce swelling and bruising.    Hypokalemia Replenish. Check magnesium  First distal phalanx fracture. Possible nondisplaced fracture on x-ray. Splint applied in the ED. Outpatient hand surgery evaluation.  Prolonged QT interval (527) Hold Celexa and Zoloft. Avoid Haldol or Seroquel. Replenish potassium. Check magnesium.      Code Status : Full code (discussed with daughter)  Family Communication  : Daughter at bedside  Disposition Plan  :  Step down monitoring  Barriers For Discharge : Active symptoms  Consults  :   Trauma surgery (Dr. Ulice Bold) Dr Ditty  Procedures  :  CT head, cervical spine and maxillofacial  DVT Prophylaxis  :  SCD  Lab Results  Component Value Date   PLT 191 12/03/2016    Antibiotics  :    Anti-infectives    None        Objective:   Vitals:   12/02/16 2227 12/03/16 0410 12/03/16 0418 12/03/16 0900  BP: (!) 158/73  (!) 164/76 (!) 162/97  Pulse: 83 (!) 34 (!) 32 83    Resp: 18 14 (!) 21   Temp: 98.3 F (36.8 C) 99 F (37.2 C)  98 F (36.7 C)  TempSrc: Oral Oral  Axillary  SpO2: 99% 98% 98%   Weight:      Height:        Wt Readings from Last 3 Encounters:  12/02/16 75.2 kg (165 lb 12.6 oz)     Intake/Output Summary (Last 24 hours) at 12/03/16 1118 Last data filed at 12/03/16 1043  Gross per 24 hour  Intake              263 ml  Output             1700 ml  Net            -1437 ml     Physical Exam  Gen: not in distress HEENT: ecchymoses of rt periorbital area (Improved), pupils reactive b/l,  moist mucosa, supple neck Chest: clear b/l, no added sounds CVS: N S1&S2, no murmurs, rubs or gallop GI: soft, NT, ND,  Musculoskeletal: warm, no edema, splinting over left hand CNS:  moves all limbs, not oriented    Data Review:    CBC  Recent Labs Lab 12/01/16 1007 12/02/16 0041 12/03/16 0239  WBC 8.6 13.9* 11.0*  HGB 11.7* 11.0* 12.1*  HCT 36.6* 34.9* 37.1*  PLT 185 191 191  MCV 89.5 89.3 88.3  MCH 28.6 28.1 28.8  MCHC 32.0 31.5 32.6  RDW 13.8 13.7 13.5  LYMPHSABS 1.2 0.8  --   MONOABS 0.6 0.9  --   EOSABS 0.3 0.0  --   BASOSABS 0.0 0.0  --     Chemistries   Recent Labs Lab 12/01/16 1007 12/02/16 0041 12/03/16 0239  NA 140 138 137  K 3.7 3.0* 3.1*  CL 102 101 102  CO2 29 27 24   GLUCOSE 93 132* 100*  BUN 16 16 11   CREATININE 0.71 0.80 0.61  CALCIUM 8.7* 8.6* 8.5*  AST 26  --   --   ALT 23  --   --   ALKPHOS 61  --   --   BILITOT 0.4  --   --    ------------------------------------------------------------------------------------------------------------------ No results for input(s): CHOL, HDL, LDLCALC, TRIG, CHOLHDL, LDLDIRECT in the last 72 hours.  No results found for: HGBA1C ------------------------------------------------------------------------------------------------------------------ No results for input(s): TSH, T4TOTAL, T3FREE, THYROIDAB in the last 72 hours.  Invalid input(s):  FREET3 ------------------------------------------------------------------------------------------------------------------ No results for input(s): VITAMINB12, FOLATE, FERRITIN, TIBC, IRON, RETICCTPCT in the last 72 hours.  Coagulation profile  Recent Labs Lab 12/02/16 0835  INR 1.07    No results for input(s): DDIMER in the last 72 hours.  Cardiac Enzymes  Recent Labs Lab 12/02/16 0041  TROPONINI <0.03   ------------------------------------------------------------------------------------------------------------------ No results found for: BNP  Inpatient Medications  Scheduled Meds: . [START ON 2020/09/2516] mouth rinse  15 mL Mouth Rinse BID  . memantine  10 mg Oral BID  . polyethylene glycol  17 g Oral Q3 days  . potassium chloride SA  20 mEq Oral Daily  . sodium chloride flush  3 mL Intravenous Q12H   Continuous Infusions:  PRN Meds:.acetaminophen **OR** acetaminophen, LORazepam, ondansetron **OR** ondansetron (ZOFRAN) IV  Micro Results Recent Results (from the past 240 hour(s))  MRSA PCR Screening     Status: None   Collection Time: 12/02/16 11:48 PM  Result Value Ref Range Status   MRSA by PCR NEGATIVE NEGATIVE Final    Comment:        The GeneXpert MRSA Assay (FDA approved for NASAL specimens only), is one component of a comprehensive MRSA colonization surveillance program. It is not intended to diagnose MRSA infection nor to guide or monitor treatment for MRSA infections.     Radiology Reports Dg Chest 2 View  Result Date: 12/02/2016 CLINICAL DATA:  Status post unwitnessed fall. Concern for chest injury. Initial encounter. EXAM: CHEST  2 VIEW COMPARISON:  None. FINDINGS: The lungs are well-aerated and clear. There is no evidence of focal opacification, pleural effusion or pneumothorax. The heart is borderline normal in size. No acute osseous abnormalities are seen. Anterior bridging osteophytes are seen along the thoracic spine. The patient's right  shoulder arthroplasty is only minimally imaged. IMPRESSION: No acute cardiopulmonary process seen. No displaced rib fracture seen. Electronically Signed   By: Roanna Raider M.D.   On: 12/02/2016 02:04   Ct Head Wo Contrast  Result Date: 12/03/2016 CLINICAL DATA:  Subdural hematoma.  Fall 12/01/2016 EXAM: CT HEAD WITHOUT CONTRAST TECHNIQUE: Contiguous axial images were obtained from the base of the skull through the vertex without intravenous contrast. COMPARISON:  CT head 12/02/2016 FINDINGS: Brain: Moderate atrophy. Negative for hydrocephalus or midline shift Small intermediate density subdural fluid collections bilaterally. This is slightly increased on the right now measuring 5 mm. Left-sided subdural fluid collection also slightly increased in size from the prior study, now measuring 5.3 mm. Negative for acute infarct or mass Vascular: Negative for hyperdense vessel Skull: Negative for skull fracture.  Soft tissue swelling right face Sinuses/Orbits: Air-fluid level right maxillary sinus containing high density blood unchanged. Other: None IMPRESSION: Small intermediate density extra-axial fluid collections bilaterally slightly increased in size from the prior study compatible with subdural hematoma mixed with CSF. No midline shift Air-fluid level with blood in the right maxillary sinus unchanged Electronically Signed   By: Marlan Palau M.D.   On: 12/03/2016 08:43  Ct Head Wo Contrast  Result Date: 12/02/2016 CLINICAL DATA:  Status post unwitnessed fall. Bruising about the right forehead and right orbit. Vomiting. Concern for head or cervical spine injury. Initial encounter. EXAM: CT HEAD WITHOUT CONTRAST CT MAXILLOFACIAL WITHOUT CONTRAST CT CERVICAL SPINE WITHOUT CONTRAST TECHNIQUE: Multidetector CT imaging of the head, cervical spine, and maxillofacial structures were performed using the standard protocol without intravenous contrast. Multiplanar CT image reconstructions of the cervical spine and  maxillofacial structures were also generated. COMPARISON:  CT of the head performed 12/01/2016 FINDINGS: CT HEAD FINDINGS Brain: There is suggestion of a small 5 mm acute subdural hematoma overlying the left parietal lobe, new from the recent prior study. Prominence of the ventricles and sulci reflects mild to moderate cortical volume loss. Scattered periventricular and subcortical white matter change likely reflects small vessel ischemic microangiopathy. The brainstem and fourth ventricle are within normal limits. The basal ganglia are unremarkable in appearance. The cerebral hemispheres demonstrate grossly normal gray-white differentiation. No mass effect or midline shift is seen. Vascular: No hyperdense vessel or unexpected calcification. Skull: There is no evidence of fracture; visualized osseous structures are unremarkable in appearance. Other: Prominent soft tissue swelling is noted overlying the right frontal and parietal calvarium, and surrounding the right orbit. Mild soft tissue air at the right side of the nose may reflect an underlying soft tissue laceration. CT MAXILLOFACIAL FINDINGS Osseous: There is no evidence of fracture or dislocation. The maxilla and mandible appear intact. The nasal bone is unremarkable in appearance. Maxillary and mandibular dental caries are noted. Orbits: The orbits are intact bilaterally. Sinuses: Blood is noted partially filling the right maxillary sinus. The remaining visualized paranasal sinuses and mastoid air cells are well-aerated. Soft tissues: Prominent soft tissue swelling noted overlying the right frontal and parietal calvarium, and surrounding the right orbit to. The parapharyngeal fat planes are preserved. The nasopharynx, oropharynx and hypopharynx are unremarkable in appearance. The visualized portions of the valleculae and piriform sinuses are grossly unremarkable. The parotid and submandibular glands are within normal limits. No cervical lymphadenopathy is  seen. CT CERVICAL SPINE FINDINGS Alignment: Normal. Skull base and vertebrae: No acute fracture. No primary bone lesion or focal pathologic process. Soft tissues and spinal canal: No prevertebral fluid or swelling. No visible canal hematoma. Disc levels: Mild intervertebral disc space narrowing is noted at C3-C4 and at C6-C7. Underlying facet disease is noted at the mid cervical spine. Upper chest: A 3.4 cm hypodensity is noted at the right thyroid lobe. The visualized lung apices are clear. Other: No additional soft tissue abnormalities are seen. IMPRESSION: 1. Suggestion of small 5 mm acute subdural hematoma overlying the left parietal lobe, new from the recent prior study. 2. No evidence of fracture or subluxation along the cervical spine. 3. Prominent soft tissue swelling overlying the right frontal and parietal calvarium, and surrounding the right orbit. Mild soft tissue air at the right side of the nose may reflect an underlying soft tissue laceration. 4. Blood noted partially filling the right maxillary sinus, new from the recent prior study. 5. Mild to moderate cortical volume loss and scattered small vessel ischemic microangiopathy. 6. Maxillary and mandibular dental caries noted. 7. Mild degenerative change along the cervical spine. 8. **An incidental finding of potential clinical significance has been found. 3.4 cm hypodensity at the right thyroid lobe. Recommend further evaluation with thyroid ultrasound. If patient is clinically hyperthyroid, consider nuclear medicine thyroid uptake and scan.** Critical Value/emergent results were called by telephone at the time of interpretation  on 12/02/2016 at 3:04 am to Dr. Glynn Octave, who verbally acknowledged these results. Electronically Signed   By: Roanna Raider M.D.   On: 12/02/2016 03:05   Ct Head Wo Contrast  Result Date: 12/01/2016 CLINICAL DATA:  Unwitnessed fall. Found sitting in shower with the hematoma along the left side of the head and left  shoulder. Baseline dementia. EXAM: CT HEAD WITHOUT CONTRAST TECHNIQUE: Contiguous axial images were obtained from the base of the skull through the vertex without intravenous contrast. COMPARISON:  None. FINDINGS: Brain: Mild atrophy and white matter changes are evident. No acute infarct, hemorrhage, or mass lesion is present. Ventricles are of normal size. No significant extra-axial collection is present. Vascular: Atherosclerotic calcifications are present within the cavernous internal carotid artery's bilaterally. No hyperdense vessel is present. Skull: A left frontal scalp hematoma is present. There is no underlying fracture. The calvarium is intact. No focal lytic or blastic lesions are present. Sinuses/Orbits: The paranasal sinuses and mastoid air cells are clear. The globes and orbits are within normal limits bilaterally. IMPRESSION: 1. Left frontal scalp hematoma without underlying fracture. 2. No acute intracranial abnormality. 3. Mild atrophy and white matter disease likely reflects the sequela of chronic microvascular ischemia. Electronically Signed   By: Marin Roberts M.D.   On: 12/01/2016 11:13   Ct Cervical Spine Wo Contrast  Result Date: 12/02/2016 CLINICAL DATA:  Status post unwitnessed fall. Bruising about the right forehead and right orbit. Vomiting. Concern for head or cervical spine injury. Initial encounter. EXAM: CT HEAD WITHOUT CONTRAST CT MAXILLOFACIAL WITHOUT CONTRAST CT CERVICAL SPINE WITHOUT CONTRAST TECHNIQUE: Multidetector CT imaging of the head, cervical spine, and maxillofacial structures were performed using the standard protocol without intravenous contrast. Multiplanar CT image reconstructions of the cervical spine and maxillofacial structures were also generated. COMPARISON:  CT of the head performed 12/01/2016 FINDINGS: CT HEAD FINDINGS Brain: There is suggestion of a small 5 mm acute subdural hematoma overlying the left parietal lobe, new from the recent prior study.  Prominence of the ventricles and sulci reflects mild to moderate cortical volume loss. Scattered periventricular and subcortical white matter change likely reflects small vessel ischemic microangiopathy. The brainstem and fourth ventricle are within normal limits. The basal ganglia are unremarkable in appearance. The cerebral hemispheres demonstrate grossly normal gray-white differentiation. No mass effect or midline shift is seen. Vascular: No hyperdense vessel or unexpected calcification. Skull: There is no evidence of fracture; visualized osseous structures are unremarkable in appearance. Other: Prominent soft tissue swelling is noted overlying the right frontal and parietal calvarium, and surrounding the right orbit. Mild soft tissue air at the right side of the nose may reflect an underlying soft tissue laceration. CT MAXILLOFACIAL FINDINGS Osseous: There is no evidence of fracture or dislocation. The maxilla and mandible appear intact. The nasal bone is unremarkable in appearance. Maxillary and mandibular dental caries are noted. Orbits: The orbits are intact bilaterally. Sinuses: Blood is noted partially filling the right maxillary sinus. The remaining visualized paranasal sinuses and mastoid air cells are well-aerated. Soft tissues: Prominent soft tissue swelling noted overlying the right frontal and parietal calvarium, and surrounding the right orbit to. The parapharyngeal fat planes are preserved. The nasopharynx, oropharynx and hypopharynx are unremarkable in appearance. The visualized portions of the valleculae and piriform sinuses are grossly unremarkable. The parotid and submandibular glands are within normal limits. No cervical lymphadenopathy is seen. CT CERVICAL SPINE FINDINGS Alignment: Normal. Skull base and vertebrae: No acute fracture. No primary bone lesion or focal pathologic  process. Soft tissues and spinal canal: No prevertebral fluid or swelling. No visible canal hematoma. Disc levels: Mild  intervertebral disc space narrowing is noted at C3-C4 and at C6-C7. Underlying facet disease is noted at the mid cervical spine. Upper chest: A 3.4 cm hypodensity is noted at the right thyroid lobe. The visualized lung apices are clear. Other: No additional soft tissue abnormalities are seen. IMPRESSION: 1. Suggestion of small 5 mm acute subdural hematoma overlying the left parietal lobe, new from the recent prior study. 2. No evidence of fracture or subluxation along the cervical spine. 3. Prominent soft tissue swelling overlying the right frontal and parietal calvarium, and surrounding the right orbit. Mild soft tissue air at the right side of the nose may reflect an underlying soft tissue laceration. 4. Blood noted partially filling the right maxillary sinus, new from the recent prior study. 5. Mild to moderate cortical volume loss and scattered small vessel ischemic microangiopathy. 6. Maxillary and mandibular dental caries noted. 7. Mild degenerative change along the cervical spine. 8. **An incidental finding of potential clinical significance has been found. 3.4 cm hypodensity at the right thyroid lobe. Recommend further evaluation with thyroid ultrasound. If patient is clinically hyperthyroid, consider nuclear medicine thyroid uptake and scan.** Critical Value/emergent results were called by telephone at the time of interpretation on 12/02/2016 at 3:04 am to Dr. Glynn Octave, who verbally acknowledged these results. Electronically Signed   By: Roanna Raider M.D.   On: 12/02/2016 03:05   Dg Shoulder Left  Result Date: 12/02/2016 CLINICAL DATA:  Status post fall, with left shoulder bruising. Initial encounter. EXAM: LEFT SHOULDER - 2+ VIEW COMPARISON:  Left shoulder radiographs performed 12/01/2016 FINDINGS: There is no evidence of fracture or dislocation. The patient's left shoulder arthroplasty appears grossly intact, without evidence of loosening. Degenerative change is noted about the glenoid. Mild  degenerative change is noted at the left acromioclavicular joint. No definite soft tissue abnormalities are characterized on radiograph. IMPRESSION: No evidence of fracture or dislocation. Left shoulder arthroplasty appears grossly intact, without evidence of loosening. Electronically Signed   By: Roanna Raider M.D.   On: 12/02/2016 02:07   Dg Shoulder Left  Result Date: 12/01/2016 CLINICAL DATA:  Fall. EXAM: LEFT SHOULDER - 2+ VIEW COMPARISON:  None. FINDINGS: Left shoulder hemi arthroplasty device is identified. The hardware components are in anatomic alignment. No periprosthetic fracture. Mild degenerative changes noted at the acromioclavicular joint. IMPRESSION: 1. No acute finding. 2. Previous left shoulder arthroplasty. Electronically Signed   By: Signa Kell M.D.   On: 12/01/2016 10:01   Dg Knee Complete 4 Views Left  Result Date: 12/02/2016 CLINICAL DATA:  Status post unwitnessed fall, with left knee bruising. Initial encounter. EXAM: LEFT KNEE - COMPLETE 4+ VIEW COMPARISON:  None. FINDINGS: There is no evidence of fracture or dislocation. The patient's total knee arthroplasty is grossly unremarkable in appearance, without evidence of loosening. A small knee joint effusion noted. Mild diffuse soft tissue swelling is noted about the patella. Degenerative osseous fragments are noted about the distal quadriceps tendon. Scattered vascular calcifications are seen. IMPRESSION: 1. No evidence of fracture or dislocation. 2. Total knee arthroplasty is grossly unremarkable, without evidence of loosening. 3. Small knee joint effusion noted. 4. Scattered vascular calcifications seen. Electronically Signed   By: Roanna Raider M.D.   On: 12/02/2016 02:05   Dg Hand Complete Left  Result Date: 12/02/2016 CLINICAL DATA:  Left hand pain after a fall, hematoma EXAM: LEFT HAND - COMPLETE 3+ VIEW COMPARISON:  None. FINDINGS: Examination is limited by a pose a shin Ing. Irregular linear lucencies are visualized  within the distal phalanx of the first digit at the proximal mid and distal aspect. No subluxation. Advanced arthritic changes at the first [facility]. Marked DIP and PIP joint arthritis with narrowing and osteophyte. IMPRESSION: 1. Multiple irregular lucencies within the proximal mid and distal aspect of the first distal phalanx, suspicious for nondisplaced fractures. 2. Marked arthritic change at the wrist and digits. Electronically Signed   By: Jasmine PangKim  Fujinaga M.D.   On: 12/02/2016 02:59   Ct Maxillofacial Wo Contrast  Result Date: 12/02/2016 CLINICAL DATA:  Status post unwitnessed fall. Bruising about the right forehead and right orbit. Vomiting. Concern for head or cervical spine injury. Initial encounter. EXAM: CT HEAD WITHOUT CONTRAST CT MAXILLOFACIAL WITHOUT CONTRAST CT CERVICAL SPINE WITHOUT CONTRAST TECHNIQUE: Multidetector CT imaging of the head, cervical spine, and maxillofacial structures were performed using the standard protocol without intravenous contrast. Multiplanar CT image reconstructions of the cervical spine and maxillofacial structures were also generated. COMPARISON:  CT of the head performed 12/01/2016 FINDINGS: CT HEAD FINDINGS Brain: There is suggestion of a small 5 mm acute subdural hematoma overlying the left parietal lobe, new from the recent prior study. Prominence of the ventricles and sulci reflects mild to moderate cortical volume loss. Scattered periventricular and subcortical white matter change likely reflects small vessel ischemic microangiopathy. The brainstem and fourth ventricle are within normal limits. The basal ganglia are unremarkable in appearance. The cerebral hemispheres demonstrate grossly normal gray-white differentiation. No mass effect or midline shift is seen. Vascular: No hyperdense vessel or unexpected calcification. Skull: There is no evidence of fracture; visualized osseous structures are unremarkable in appearance. Other: Prominent soft tissue swelling is  noted overlying the right frontal and parietal calvarium, and surrounding the right orbit. Mild soft tissue air at the right side of the nose may reflect an underlying soft tissue laceration. CT MAXILLOFACIAL FINDINGS Osseous: There is no evidence of fracture or dislocation. The maxilla and mandible appear intact. The nasal bone is unremarkable in appearance. Maxillary and mandibular dental caries are noted. Orbits: The orbits are intact bilaterally. Sinuses: Blood is noted partially filling the right maxillary sinus. The remaining visualized paranasal sinuses and mastoid air cells are well-aerated. Soft tissues: Prominent soft tissue swelling noted overlying the right frontal and parietal calvarium, and surrounding the right orbit to. The parapharyngeal fat planes are preserved. The nasopharynx, oropharynx and hypopharynx are unremarkable in appearance. The visualized portions of the valleculae and piriform sinuses are grossly unremarkable. The parotid and submandibular glands are within normal limits. No cervical lymphadenopathy is seen. CT CERVICAL SPINE FINDINGS Alignment: Normal. Skull base and vertebrae: No acute fracture. No primary bone lesion or focal pathologic process. Soft tissues and spinal canal: No prevertebral fluid or swelling. No visible canal hematoma. Disc levels: Mild intervertebral disc space narrowing is noted at C3-C4 and at C6-C7. Underlying facet disease is noted at the mid cervical spine. Upper chest: A 3.4 cm hypodensity is noted at the right thyroid lobe. The visualized lung apices are clear. Other: No additional soft tissue abnormalities are seen. IMPRESSION: 1. Suggestion of small 5 mm acute subdural hematoma overlying the left parietal lobe, new from the recent prior study. 2. No evidence of fracture or subluxation along the cervical spine. 3. Prominent soft tissue swelling overlying the right frontal and parietal calvarium, and surrounding the right orbit. Mild soft tissue air at the  right side of the nose may  reflect an underlying soft tissue laceration. 4. Blood noted partially filling the right maxillary sinus, new from the recent prior study. 5. Mild to moderate cortical volume loss and scattered small vessel ischemic microangiopathy. 6. Maxillary and mandibular dental caries noted. 7. Mild degenerative change along the cervical spine. 8. **An incidental finding of potential clinical significance has been found. 3.4 cm hypodensity at the right thyroid lobe. Recommend further evaluation with thyroid ultrasound. If patient is clinically hyperthyroid, consider nuclear medicine thyroid uptake and scan.** Critical Value/emergent results were called by telephone at the time of interpretation on 12/02/2016 at 3:04 am to Dr. Glynn Octave, who verbally acknowledged these results. Electronically Signed   By: Roanna Raider M.D.   On: 12/02/2016 03:05    Time Spent in minutes  25   Eddie North M.D on 12/03/2016 at 11:18 AM  Between 7am to 7pm - Pager - [phone]  After 7pm go to [email or web address] - password [facility]  [facility] -  Office  [phone]

## 2016-12-03 NOTE — Evaluation (Signed)
Physical Therapy Evaluation Patient Details Name: Matthew Hancock MRN: [id number] DOB: 03/29/1928 Today's Date: 12/03/2016   History of Present Illness  Pt is a 83 yo male with subdermal hematoma resulting from mechanical fall on 12/01/16. Pt release back to his memory care facility where he experienced a second mechanical fall on 12/02/16 resulting in fx L thumb. PMH significant for dementian, and hypertension.  Clinical Impression  Pt admitted with above diagnosis. Pt currently with functional limitations due to the deficits listed below (see PT Problem List). Pt is limited in his ability to follow directions secondary to dementia. Pt currently modAx2 for bed mobility and stand pivot transfer to chair.  Pt will benefit from skilled PT to increase their independence and safety with mobility to allow discharge to the venue listed below.       Follow Up Recommendations SNF    Equipment Recommendations  None recommended by PT    Recommendations for Other Services OT consult     Precautions / Restrictions Precautions Precautions: Fall Precaution Comments: 2 falls within 2 days Restrictions Weight Bearing Restrictions: No      Mobility  Bed Mobility Overal bed mobility: Needs Assistance Bed Mobility: Supine to Sit     Supine to sit: Mod assist;+2 for physical assistance;HOB elevated     General bed mobility comments: modA for LE management to EoB and for trunk to upright   Transfers Overall transfer level: Needs assistance Equipment used: 2 person hand held assist Transfers: Stand Pivot Transfers   Stand pivot transfers: Mod assist;+2 physical assistance       General transfer comment: modA for steadying max pt indicated initial understanding for transferring to chair verbal and tactile cues need to reach for armrest of recliner, pt with adequate strength to transfer, limited by understanding of task     Balance Overall balance assessment: Needs assistance Sitting-balance  support: Feet supported;Bilateral upper extremity supported Sitting balance-Leahy Scale: Fair     Standing balance support: Bilateral upper extremity supported Standing balance-Leahy Scale: Poor Standing balance comment: requires assist of 2 for steadying                              Pertinent Vitals/Pain Pain Assessment: Faces Faces Pain Scale: Hurts a little bit Pain Location: L hand Pain Descriptors / Indicators: Grimacing;Guarding (with use of L hand) Pain Intervention(s): Monitored during session  VSS    Home Living Family/patient expects to be discharged to:: Skilled nursing facility                 Additional Comments: pt resident of memory care facility. Pt daughter was not present during evaluation and further imformation was not provided by patient    Prior Function Level of Independence: Needs assistance         Comments: unable to determine baseline due to dementia        Extremity/Trunk Assessment   Upper Extremity Assessment Upper Extremity Assessment: Generalized weakness;Difficult to assess due to impaired cognition    Lower Extremity Assessment Lower Extremity Assessment: Generalized weakness;Difficult to assess due to impaired cognition       Communication   Communication: Receptive difficulties;Expressive difficulties  Cognition Arousal/Alertness: Awake/alert Behavior During Therapy: Flat affect;Impulsive;Agitated Overall Cognitive Status: No family/caregiver present to determine baseline cognitive functioning  General Comments: pt occasionally able to follow single step command      General Comments General comments (skin integrity, edema, etc.): nurse tech in room helped with transfer to recliner        Assessment/Plan    PT Assessment Patient needs continued PT services  PT Problem List Decreased activity tolerance;Decreased safety awareness;Decreased cognition;Decreased  knowledge of precautions;Pain       PT Treatment Interventions Gait training;DME instruction;Functional mobility training;Therapeutic activities;Therapeutic exercise;Balance training;Patient/family education    PT Goals (Current goals can be found in the Care Plan section)  Acute Rehab PT Goals Patient Stated Goal: to go home and get warm PT Goal Formulation: Patient unable to participate in goal setting Time For Goal Achievement: 12/17/16 Potential to Achieve Goals: Poor    Frequency Min 2X/week    AM-PAC PT "6 Clicks" Daily Activity  Outcome Measure Difficulty turning over in bed (including adjusting bedclothes, sheets and blankets)?: A Lot Difficulty moving from lying on back to sitting on the side of the bed? : Total Difficulty sitting down on and standing up from a chair with arms (e.g., wheelchair, bedside commode, etc,.)?: Total Help needed moving to and from a bed to chair (including a wheelchair)?: A Lot Help needed walking in hospital room?: A Lot Help needed climbing 3-5 steps with a railing? : Total 6 Click Score: 9    End of Session Equipment Utilized During Treatment: Gait belt Activity Tolerance: Treatment limited secondary to agitation Patient left: in chair;with call bell/phone within reach;with nursing/sitter in room;with chair alarm set Nurse Communication: Mobility status PT Visit Diagnosis: Unsteadiness on feet (R26.81);Other abnormalities of gait and mobility (R26.89);Repeated falls (R29.6);History of falling (Z91.81);Difficulty in walking, not elsewhere classified (R26.2);Other symptoms and signs involving the nervous system (R29.898);Pain Pain - Right/Left: Left Pain - part of body: Hand    Time: [id number] PT Time Calculation (min) (ACUTE ONLY): 31 min   Charges:   PT Evaluation $PT Eval Moderate Complexity: 1 Procedure PT Treatments $Therapeutic Activity: 8-22 mins   PT G Codes:       Brandyn Thien B. Beverely RisenVan Fleet PT, DPT Acute Rehabilitation  [phone]  [phone] Pager [phone]) [phone]    [name] 12/03/2016, 4:51 PM

## 2016-12-03 NOTE — Consult Note (Signed)
CC:  Chief Complaint  Patient presents with  . Fall    HPI: Matthew Hancock is a 83 y.o. male with history of dementia, living in memory care facility, who was brought to ER 6/17 by EMS after being found down. Unable to obtain history from patient. History gained by chart review and from daughter present. This is his second unwitnessed fall in the past several days. He was recently evaluated after a head injury 12/01/2016 and had a negative work up including CT head so he was discharged with careful monitoring - hourly monitoring. Found down again so brought for in evaluation. No seizure like activity. Still able to move all extremities. Not complaining of anything. Daughter reports at baseline with exception of agitation yesterday requiring ativan,  PMH: Past Medical History:  Diagnosis Date  . Dementia   . Hypertension     PSH: Past Surgical History:  Procedure Laterality Date  . KNEE SURGERY Bilateral     SH: Social History  Substance Use Topics  . Smoking status: Never Smoker  . Smokeless tobacco: Never Used  . Alcohol use No    MEDS: Prior to Admission medications   Medication Sig Start Date End Date Taking? Authorizing Provider  B Complex Vitamins (VITAMIN-B COMPLEX PO) Take 1 tablet by mouth daily.   Yes [provider]  Cholecalciferol (VITAMIN D) 2000 units CAPS Take 2,000 Units by mouth daily.   Yes [provider]  citalopram (CELEXA) 20 MG tablet Take 20 mg by mouth daily.   Yes [provider]  Magnesium Chloride-Calcium 64-106 MG TBEC Take 1 tablet by mouth every Monday, Wednesday, and Friday.   Yes [provider]  Melatonin 10 MG CAPS Take 10 mg by mouth at bedtime.   Yes [provider]  memantine (NAMENDA) 10 MG tablet Take 10 mg by mouth 2 (two) times daily.   Yes [provider]  mineral oil liquid Place 2 mLs in ear(s) once a week. On Thursday   Yes [provider]  Multiple Vitamins-Minerals  (MULTIVITAMIN WITH MINERALS) tablet Take 1 tablet by mouth daily.   Yes [provider]  polyethylene glycol (MIRALAX / GLYCOLAX) packet Take 17 g by mouth every 3 (three) days.    Yes [provider]  potassium chloride SA (K-DUR,KLOR-CON) 20 MEQ tablet Take 20 mEq by mouth daily.   Yes [provider]  sertraline (ZOLOFT) 25 MG tablet Take 25 mg by mouth daily. Once daily for 2 weeks. Then d/c   Yes [provider]    ALLERGY: No Known Allergies  ROS: unable to obtain ROS  Vitals:   12/03/16 0900 12/03/16 1125  BP: (!) 162/97 132/82  Pulse: 83   Resp:    Temp: 98 F (36.7 C) 97.8 F (36.6 C)   Resting comfortably, NAD PERRL Awake Follows commands intermittently  Moves all extremities anti-gravity Trying to take off right wrist restraint Left upper extremity with thumb splica.  CN grossly intact  IMAGING: CT HEAD IMPRESSION: Small intermediate density extra-axial fluid collections bilaterally slightly increased in size from the prior study compatible with subdural hematoma mixed with CSF. No midline shift Air-fluid level with blood in the right maxillary sinus unchanged  IMPRESSION/PLAN 83 y.o. male with small, bilateral SDH - fairly unchanged from yesterday to today. Neuro examination is difficult to perform secondary to dementia, but daughter does report he is at baseline. He does move all extremities well and CN appear grossly intact. No NS intervention necessary for these  small SDH. Discussed with daughter at beside who is concerned he needs more attention than just being discharged to the memory clinic. I am unfamiliar with this clinic. May be beneficial for Pt/OT eval and recs for SNF?  - Keppra 500mg  BID x7days for seizure prophylaxis  - Continue to monitor - Okay to ambulate with assistance - F/U outpt 4 weeks for repeat CT head - Call for any concerns

## 2016-12-04 DIAGNOSIS — F0390 Unspecified dementia without behavioral disturbance: Secondary | ICD-10-CM

## 2016-12-04 DIAGNOSIS — S62525A Nondisplaced fracture of distal phalanx of left thumb, initial encounter for closed fracture: Secondary | ICD-10-CM | POA: Diagnosis present

## 2016-12-04 DIAGNOSIS — R9431 Abnormal electrocardiogram [ECG] [EKG]: Secondary | ICD-10-CM | POA: Diagnosis present

## 2016-12-04 DIAGNOSIS — W19XXXA Unspecified fall, initial encounter: Secondary | ICD-10-CM

## 2016-12-04 DIAGNOSIS — R930 Abnormal findings on diagnostic imaging of skull and head, not elsewhere classified: Secondary | ICD-10-CM

## 2016-12-04 DIAGNOSIS — Y92009 Unspecified place in unspecified non-institutional (private) residence as the place of occurrence of the external cause: Secondary | ICD-10-CM

## 2016-12-04 LAB — BASIC METABOLIC PANEL
Anion gap: 9 (ref 5–15)
BUN: 21 mg/dL — AB (ref 6–20)
CHLORIDE: 102 mmol/L (ref 101–111)
CO2: 26 mmol/L (ref 22–32)
CREATININE: 0.8 mg/dL (ref 0.61–1.24)
Calcium: 8.6 mg/dL — ABNORMAL LOW (ref 8.9–10.3)
GFR calc Af Amer: >60 mL/min (ref >60)
GFR calc non Af Amer: >60 mL/min (ref >60)
Glucose, Bld: 99 mg/dL (ref 65–99)
Potassium: 3.8 mmol/L (ref 3.5–5.1)
SODIUM: 137 mmol/L (ref 135–145)

## 2016-12-04 MED ORDER — LEVETIRACETAM 500 MG PO TABS: 500.0000 mg | ORAL_TABLET | Freq: Two times a day (BID) | ORAL | 0 refills | Status: AC

## 2016-12-04 NOTE — Clinical Social Work Note (Signed)
CSW facilitated patient discharge including contacting patient family and facility to confirm patient discharge plans. Clinical information faxed to facility and family agreeable with plan. CSW arranged ambulance transport via [facility] to [facility] at 6:30 pm. RN to call report prior to discharge [phone]).  CSW will sign off for now as social work intervention is no longer needed. Please consult us again if new needs arise.  Charlynn CourtSarah Yolinda Duerr, CSW [phone]

## 2016-12-04 NOTE — Progress Notes (Signed)
Patient discharged to SNF via [facility]. Discharge pack given to [facility] and patient alert and stable at his baseline. Daughter left early to meet him at the facility and took patients bag of belongings.

## 2016-12-04 NOTE — [place] FL2 (Addendum)
[place] MEDICAID FL2 LEVEL OF CARE SCREENING TOOL     IDENTIFICATION  Patient Name: Matthew Hancock Birthdate: 04/02/1928 Sex: male Admission Date (Current Location): 12/02/2016  [facility] [place]   Facility and Address:  [facility], [address], [place], [place] [address]      Provider Number: [id number]  Attending Physician Name and Address:  Eddie Northhungel, Nishant, MD  Relative Name and Phone Number:       Current Level of Care: Hospital Recommended Level of Care: Assisted Living Facility, [facility] Prior Approval Number:    Date Approved/Denied:   PASRR Number:    Discharge Plan: Other (Comment) ([facility])    Current Diagnoses: Patient Active Problem List   Diagnosis Date Noted  . Pressure injury of skin 12/03/2016  . Subdural hematoma (HCC) 12/02/2016  . Abnormal CT scan of head 12/02/2016  . Fall at home, initial encounter 12/02/2016  . Dementia 12/02/2016  . Leukocytosis 12/02/2016  . Hypokalemia 12/02/2016  . BPH (benign prostatic hyperplasia) 12/02/2016  . Constipation 12/02/2016    Orientation RESPIRATION BLADDER Height & Weight     Self  Normal Incontinent Weight: 165 lb 12.6 oz (75.2 kg) Height:  6\' 1"  (185.4 cm)  BEHAVIORAL SYMPTOMS/MOOD NEUROLOGICAL BOWEL NUTRITION STATUS   (Calm, Uncooperative)  (Dementia) Incontinent Diet (Regular)  AMBULATORY STATUS COMMUNICATION OF NEEDS Skin   Extensive Assist Verbally Skin abrasions, Bruising, Other (Comment), PU Stage and Appropriate Care (Rash.)   PU Stage 2 Dressing:  (Mid sacrum: Foam prn.)                   Personal Care Assistance Level of Assistance  Bathing, Feeding, Dressing Bathing Assistance: Limited assistance Feeding assistance: Limited assistance Dressing Assistance: Limited assistance     Functional Limitations Info  Sight, Hearing, Speech Sight Info: Adequate Hearing Info:  Adequate Speech Info: Impaired    SPECIAL CARE FACTORS FREQUENCY  PT (By licensed PT)     PT Frequency: 3 x week              Contractures Contractures Info: Not present    Additional Factors Info  Code Status, Allergies Code Status Info: Full Allergies Info: NKDA           Current Medications (06-Aug-202018):  This is the current hospital active medication list Current Facility-Administered Medications  Medication Dose Route Frequency Provider Last Rate Last Dose  . acetaminophen (TYLENOL) tablet 650 mg  650 mg Oral Q6H PRN Michael Litterarter, Nikki, MD       Or  . acetaminophen (TYLENOL) suppository 650 mg  650 mg Rectal Q6H PRN Michael Litterarter, Nikki, MD      . levETIRAcetam (KEPPRA) tablet 500 mg  500 mg Oral BID Costella, Vincent J, PA-C   500 mg at 12/04/16 1045  . LORazepam (ATIVAN) injection 1 mg  1 mg Intravenous Q8H PRN Dhungel, Nishant, MD   1 mg at 12/03/16 1820  . MEDLINE mouth rinse  15 mL Mouth Rinse BID Dhungel, Nishant, MD   15 mL at 12/04/16 1000  . memantine (NAMENDA) tablet 10 mg  10 mg Oral BID Michael Litterarter, Nikki, MD   10 mg at 12/04/16 1045  . ondansetron (ZOFRAN) tablet 4 mg  4 mg Oral Q6H PRN Michael Litterarter, Nikki, MD       Or  . ondansetron [facility](ZOFRAN) injection 4 mg  4 mg Intravenous Q6H PRN Michael Litterarter, Nikki, MD      .  polyethylene glycol (MIRALAX / GLYCOLAX) packet 17 g  17 g Oral Q3 days Michael Litter, MD   17 g at 12/03/16 1012  . potassium chloride SA (K-DUR,KLOR-CON) CR tablet 40 mEq  40 mEq Oral Daily Dhungel, Nishant, MD   40 mEq at 12/04/16 1045  . sodium chloride flush (NS) 0.9 % injection 3 mL  3 mL Intravenous Q12H Michael Litter, MD   3 mL at 12/04/16 1140     Discharge Medications: Medication List    STOP taking these medications   citalopram 20 MG tablet Commonly known as:  CELEXA   sertraline 25 MG tablet Commonly known as:  ZOLOFT     TAKE these medications   levETIRAcetam 500 MG tablet Commonly known as:  KEPPRA Take 1 tablet (500 mg total) by mouth 2  (two) times daily.   Magnesium Chloride-Calcium 64-106 MG Tbec Take 1 tablet by mouth every Monday, Wednesday, and Friday.   Melatonin 10 MG Caps Take 10 mg by mouth at bedtime.   memantine 10 MG tablet Commonly known as:  NAMENDA Take 10 mg by mouth 2 (two) times daily.   mineral oil liquid Place 2 mLs in ear(s) once a week. On Thursday   multivitamin with minerals tablet Take 1 tablet by mouth daily.   polyethylene glycol packet Commonly known as:  MIRALAX / GLYCOLAX Take 17 g by mouth every 3 (three) days.   potassium chloride SA 20 MEQ tablet Commonly known as:  K-DUR,KLOR-CON Take 20 mEq by mouth daily.   Vitamin D 2000 units Caps Take 2,000 Units by mouth daily.   VITAMIN-B COMPLEX PO Take 1 tablet by mouth daily.     Relevant Imaging Results:  Relevant Lab Results:   Additional Information SS#: [id number]. Family wants a 24 hour caregiver and has reached out to [facility].  Margarito Liner, LCSW

## 2016-12-04 NOTE — Clinical Social Work Note (Signed)
Clinical Social Work Assessment  Patient Details  Name: Matthew Hancock MRN: [id number] Date of Birth: 1927/07/03  Date of referral:  12/04/16               Reason for consult:  Facility Placement, Discharge Planning                Permission sought to share information with:  Facility Sport and exercise psychologist, Family Supports Permission granted to share information::  Yes, Verbal Permission Granted  Name::     Matthew Hancock  Agency::  [facility], SNF's  Relationship::  Daughter  Contact Information:  [phone]  Housing/Transportation Living arrangements for the past 2 months:  [place] of Information:  Medical Team, Adult Children Patient Interpreter Needed:  None Criminal Activity/Legal Involvement Pertinent to Current Situation/Hospitalization:  No - Comment as needed Significant Relationships:  Adult Children Lives with:  Facility Resident Do you feel safe going back to the place where you live?  Yes Need for family participation in patient care:  Yes (Comment)  Care giving concerns:  Patient is from [facility] in their Memory Care Unit. PT recommending SNF once medically stable for discharge.   Social Worker assessment / plan:  CSW met with patient. Daughter and sitter at bedside. CSW introduced role and explained that PT recommendations would be discussed. Patient's daughter confirmed that patient is from [facility] in their memory care unit. She is agreeable to SNF but patient is only on day 1 of 3 for Medicare required inpatient stay. Patient stated that her siblings are lawyers and doctors and are considering private pay at [facility]. They have reached out to the facility and their hospital liason will come to assess patient today. Patient currently has a Actuary and would be required to be sitter free for 24 hours before he can discharge to SNF. [facility] does not have this requirement and can take patient back when ready if they review his  information and determine they can still meet his needs. [facility] is pending. Patient cannot go to a SNF without this. No further concerns. CSW encouraged patient's daughter to contact CSW as needed. CSW will continue to follow patient and her family for support and facilitate discharge to SNF/ALF once medically stable.  Employment status:  Retired Forensic scientist:  Medicare PT Recommendations:  [place] / Referral to community resources:  [place]  Patient/Family's Response to care:  Patient oriented to self only. Patient's daughter considering SNF vs. ALF. Patient's children supportive and involved in patient's care. Patient's daughter appreciated social work intervention.  Patient/Family's Understanding of and Emotional Response to Diagnosis, Current Treatment, and Prognosis:  Patient oriented to self only. Patient's daughter has a good understanding of the reason for admission and his need for physical therapy at whichever venue they choose. Patient's daughter has taken notes whenever staff come in the room. Patient's daughter appears happy with hospital care.  Emotional Assessment Appearance:  Appears stated age Attitude/Demeanor/Rapport:  Unable to Assess Affect (typically observed):  Unable to Assess Orientation:  Oriented to Self Alcohol / Substance use:  Never Used Psych involvement (Current and /or in the community):  No (Comment)  Discharge Needs  Concerns to be addressed:  Care Coordination Readmission within the last 30 days:  No Current discharge risk:  Cognitively Impaired, Dependent with Mobility Barriers to Discharge:  [place] ([place]), Other (Medicare inpatient day 1 of 3.)   Candie Chroman, LCSW 12/04/2016, 2:15 PM

## 2016-12-04 NOTE — Clinical Social Work Note (Addendum)
Patient's daughter has chosen for patient to return to ALF. CSW completed and sent FL2 and therapy evaluation [facility]. Ms. Ulyses AmorHumble will review FL2 and call CSW with any changes that need to be made before CSW has MD sign it. Patient's daughter [facility]. Patient's daughter upset that CSW did not specify that it was the CSW's job to arrange transportation. She is waiting on PT to see him again today. RN has notified PT. RNCM will arrange HHPT at the facility and the daughter is requesting a walker and wheelchair. [facility]'s [facility] hour care for 1-2 weeks. She is waiting on them to call her back.  Charlynn CourtSarah Ronit Marczak, CSW [phone]  4:50 pm CSW faxed signed FL2 and discharge summary to Ms. Humble at ALF about 30 mins ago but she told RNCM she did not receive it. CSW faxed both again.  Charlynn CourtSarah Audrina Marten, CSW [phone]

## 2016-12-04 NOTE — Progress Notes (Signed)
Inadvertently Omitted G-Codes    12/03/16 1610  PT G-Codes **NOT FOR INPATIENT CLASS**  Functional Assessment Tool Used AM-PAC 6 Clicks Basic Mobility  Functional Limitation Mobility: Walking and moving around  Mobility: Walking and Moving Around Current Status (U7253(G8978) CL  Mobility: Walking and Moving Around Goal Status (G6440(G8979) Donetta PottsJ  Atilano Covelli B. Beverely RisenVan Fleet PT, DPT Acute Rehabilitation  [phone] Pager [phone](336) [phone]

## 2016-12-04 NOTE — Discharge Summary (Addendum)
Physician Discharge Summary  Matthew Hancock VWU:[id number] DOB: 1928/02/12 DOA: 12/02/2016  PCP: being followed by NP at memory care unit  Admit date: 12/02/2016 Discharge date: 01-08-202018  Admitted From: Assisted living (memory care unit) Disposition:  Return to Assisted living  Recommendations for Outpatient Follow-up:  1. Follow up with hand surgery ( Dr Merlyn Lot) in 2 weeks. 2. Follow-up with neurosurgery in 4 weeks with repeat head CT. 3. Please check EKG to monitor Qtc in next 3-4 days. I have discontinued his celexa and zoloft due to this. 4. Stop keppra after 6/26.  Patient was receiving IV normal saline, Zofran as needed for nausea and Ativan as needed for agitation. None of these will be continued upon discharge.   Equipment/Devices: Ambulate with assistance  Discharge Condition: Fair CODE STATUS: Full code Diet recommendation: Regular    Discharge Diagnoses:  Principal Problem:   Subdural hematoma (HCC)   Active Problems:   Abnormal CT scan of head   Fall at home, initial encounter   Dementia   Leukocytosis   Hypokalemia   BPH (benign prostatic hyperplasia)   Pressure injury of skin   Prolonged QT interval   Nondisplaced fracture of distal phalanx of left thumb, initial encounter for closed fracture  Brief narrative/history of present illness 83 year old male with severe dementia, resident of a memory care unit brought by EMS after found down on the floor. He had an unwitnessed fall while in the shower on the morning of admission, brought to the ED and found to have multiple bruising on the left side of the body. Head CT and left shoulder x-ray were negative and patient was discharged back to the facility. His daughter was checking him frequently at the memory care unit. Patient had his supper but was found on the floor again around 11 PM and sent back to the ED. He was found to have new bruising around his right eye and drowsy. No witnessed seizures, nausea, vomiting,  fever, chills, bowel or urinary incontinence. Repeat head CT in the ED showed 5 mm subdural hematoma over the left parietal lobe  and blood in the maxillary sinus. Also had a nondisplaced fracture involving his left thumb. Vitals in the ED was stable. Blood work showed WBC of 13.9 K, potassium of 3. EKG with normal sinus rhythm, PACs and RBBB. Chest x-ray unremarkable. Neurosurgeon consulted by ED physician who recommended no surgical intervention and monitor. Trauma surgery was consulted and hospitalist admission requested to stepdown unit.   Hospital course Principal Problem:   Subdural hematoma (HCC) Secondary to mechanical fall. Stable on stepdown without any neurological deficit.  Repeat CT head shows slightly increase in size of left-sided subdural hematoma (head CT on 6/18 also commence on 5 mm right-sided SDH and unchanged maxillary sinus fluid) -Neurosurgery consulted. No further imaging needed at this time and recommended seven-day course of Keppra for seizure prophylaxis. Patient should follow-up with neurosurgery in 4 weeks with repeat head CT. Seen by physical therapy and recommended SNF, however daughter would like to take him back to assisted living and provide 24 hours supervision. Will arrange for physical therapy.  Active Problems: Severe dementia with delirium At baseline mental status.. Continue Namenda and safety sitter at bedside. Discontinued Celexa and Zoloft due to prolonged QTC. Cannot use Haldol or Seroquel due to same reason..  Was placed on low-dose when necessary Ativan but has not been requiring it. Will not be continued upon discharge.     Leukocytosis Possibly reactive due to acute trauma. No signs  of infection. Improved.  Maxillary sinus hemorrhage. Secondary to acute trauma. Seen by trauma surgery and recommends no signs of fracture. no surgical intervention needed. Keep head of bed elevated, no  nose blowing and ice to face to reduce swelling and  bruising.    Hypokalemia Replenished. Continue potassium supplement upon discharge.  First distal left phalanx fracture. Possible nondisplaced fracture on x-ray. Splint applied in the ED. discussed with hand surgery Dr. Merlyn Lot. Patient can follow-up with him in 2 weeks as outpatient.  Prolonged QT interval (527) Discontinue Celexa and Zoloft. Avoid QT prolonging agents.       Family Communication  : Daughter at bedside  Disposition Plan  :  Return to assist living with home health and 24 hour care.  Consults  :   Trauma surgery (Dr. Ulice Bold) Dr Ditty  Procedures  :  CT head, cervical spine and maxillofacial   Discharge Instructions   Allergies as of 2020/01/717   No Known Allergies     Medication List    STOP taking these medications   citalopram 20 MG tablet Commonly known as:  CELEXA   sertraline 25 MG tablet Commonly known as:  ZOLOFT     TAKE these medications   levETIRAcetam 500 MG tablet Commonly known as:  KEPPRA Take 1 tablet (500 mg total) by mouth 2 (two) times daily.   Magnesium Chloride-Calcium 64-106 MG Tbec Take 1 tablet by mouth every Monday, Wednesday, and Friday.   Melatonin 10 MG Caps Take 10 mg by mouth at bedtime.   memantine 10 MG tablet Commonly known as:  NAMENDA Take 10 mg by mouth 2 (two) times daily.   mineral oil liquid Place 2 mLs in ear(s) once a week. On Thursday   multivitamin with minerals tablet Take 1 tablet by mouth daily.   polyethylene glycol packet Commonly known as:  MIRALAX / GLYCOLAX Take 17 g by mouth every 3 (three) days.   potassium chloride SA 20 MEQ tablet Commonly known as:  K-DUR,KLOR-CON Take 20 mEq by mouth daily.   Vitamin D 2000 units Caps Take 2,000 Units by mouth daily.   VITAMIN-B COMPLEX PO Take 1 tablet by mouth daily.      Follow-up Information    MD at SNF or memory care unit Follow up in 1 week(s).        Betha Loa, MD Follow up in 2 week(s).   Specialty:   Orthopedic Surgery Contact information: [address] [place] [address] [phone]          No Known Allergies    Procedures/Studies: Dg Chest 2 View  Result Date: 12/02/2016 CLINICAL DATA:  Status post unwitnessed fall. Concern for chest injury. Initial encounter. EXAM: CHEST  2 VIEW COMPARISON:  None. FINDINGS: The lungs are well-aerated and clear. There is no evidence of focal opacification, pleural effusion or pneumothorax. The heart is borderline normal in size. No acute osseous abnormalities are seen. Anterior bridging osteophytes are seen along the thoracic spine. The patient's right shoulder arthroplasty is only minimally imaged. IMPRESSION: No acute cardiopulmonary process seen. No displaced rib fracture seen. Electronically Signed   By: Roanna Raider M.D.   On: 12/02/2016 02:04   Ct Head Wo Contrast  Result Date: 12/03/2016 CLINICAL DATA:  Subdural hematoma.  Fall 12/01/2016 EXAM: CT HEAD WITHOUT CONTRAST TECHNIQUE: Contiguous axial images were obtained from the base of the skull through the vertex without intravenous contrast. COMPARISON:  CT head 12/02/2016 FINDINGS: Brain: Moderate atrophy. Negative for hydrocephalus or midline  shift Small intermediate density subdural fluid collections bilaterally. This is slightly increased on the right now measuring 5 mm. Left-sided subdural fluid collection also slightly increased in size from the prior study, now measuring 5.3 mm. Negative for acute infarct or mass Vascular: Negative for hyperdense vessel Skull: Negative for skull fracture.  Soft tissue swelling right face Sinuses/Orbits: Air-fluid level right maxillary sinus containing high density blood unchanged. Other: None IMPRESSION: Small intermediate density extra-axial fluid collections bilaterally slightly increased in size from the prior study compatible with subdural hematoma mixed with CSF. No midline shift Air-fluid level with blood in the right maxillary sinus unchanged  Electronically Signed   By: Marlan Palau M.D.   On: 12/03/2016 08:43   Ct Head Wo Contrast  Result Date: 12/02/2016 CLINICAL DATA:  Status post unwitnessed fall. Bruising about the right forehead and right orbit. Vomiting. Concern for head or cervical spine injury. Initial encounter. EXAM: CT HEAD WITHOUT CONTRAST CT MAXILLOFACIAL WITHOUT CONTRAST CT CERVICAL SPINE WITHOUT CONTRAST TECHNIQUE: Multidetector CT imaging of the head, cervical spine, and maxillofacial structures were performed using the standard protocol without intravenous contrast. Multiplanar CT image reconstructions of the cervical spine and maxillofacial structures were also generated. COMPARISON:  CT of the head performed 12/01/2016 FINDINGS: CT HEAD FINDINGS Brain: There is suggestion of a small 5 mm acute subdural hematoma overlying the left parietal lobe, new from the recent prior study. Prominence of the ventricles and sulci reflects mild to moderate cortical volume loss. Scattered periventricular and subcortical white matter change likely reflects small vessel ischemic microangiopathy. The brainstem and fourth ventricle are within normal limits. The basal ganglia are unremarkable in appearance. The cerebral hemispheres demonstrate grossly normal gray-white differentiation. No mass effect or midline shift is seen. Vascular: No hyperdense vessel or unexpected calcification. Skull: There is no evidence of fracture; visualized osseous structures are unremarkable in appearance. Other: Prominent soft tissue swelling is noted overlying the right frontal and parietal calvarium, and surrounding the right orbit. Mild soft tissue air at the right side of the nose may reflect an underlying soft tissue laceration. CT MAXILLOFACIAL FINDINGS Osseous: There is no evidence of fracture or dislocation. The maxilla and mandible appear intact. The nasal bone is unremarkable in appearance. Maxillary and mandibular dental caries are noted. Orbits: The orbits  are intact bilaterally. Sinuses: Blood is noted partially filling the right maxillary sinus. The remaining visualized paranasal sinuses and mastoid air cells are well-aerated. Soft tissues: Prominent soft tissue swelling noted overlying the right frontal and parietal calvarium, and surrounding the right orbit to. The parapharyngeal fat planes are preserved. The nasopharynx, oropharynx and hypopharynx are unremarkable in appearance. The visualized portions of the valleculae and piriform sinuses are grossly unremarkable. The parotid and submandibular glands are within normal limits. No cervical lymphadenopathy is seen. CT CERVICAL SPINE FINDINGS Alignment: Normal. Skull base and vertebrae: No acute fracture. No primary bone lesion or focal pathologic process. Soft tissues and spinal canal: No prevertebral fluid or swelling. No visible canal hematoma. Disc levels: Mild intervertebral disc space narrowing is noted at C3-C4 and at C6-C7. Underlying facet disease is noted at the mid cervical spine. Upper chest: A 3.4 cm hypodensity is noted at the right thyroid lobe. The visualized lung apices are clear. Other: No additional soft tissue abnormalities are seen. IMPRESSION: 1. Suggestion of small 5 mm acute subdural hematoma overlying the left parietal lobe, new from the recent prior study. 2. No evidence of fracture or subluxation along the cervical spine. 3. Prominent soft tissue  swelling overlying the right frontal and parietal calvarium, and surrounding the right orbit. Mild soft tissue air at the right side of the nose may reflect an underlying soft tissue laceration. 4. Blood noted partially filling the right maxillary sinus, new from the recent prior study. 5. Mild to moderate cortical volume loss and scattered small vessel ischemic microangiopathy. 6. Maxillary and mandibular dental caries noted. 7. Mild degenerative change along the cervical spine. 8. **An incidental finding of potential clinical significance has  been found. 3.4 cm hypodensity at the right thyroid lobe. Recommend further evaluation with thyroid ultrasound. If patient is clinically hyperthyroid, consider nuclear medicine thyroid uptake and scan.** Critical Value/emergent results were called by telephone at the time of interpretation on 12/02/2016 at 3:04 am to Dr. Glynn Octave, who verbally acknowledged these results. Electronically Signed   By: Roanna Raider M.D.   On: 12/02/2016 03:05   Ct Head Wo Contrast  Result Date: 12/01/2016 CLINICAL DATA:  Unwitnessed fall. Found sitting in shower with the hematoma along the left side of the head and left shoulder. Baseline dementia. EXAM: CT HEAD WITHOUT CONTRAST TECHNIQUE: Contiguous axial images were obtained from the base of the skull through the vertex without intravenous contrast. COMPARISON:  None. FINDINGS: Brain: Mild atrophy and white matter changes are evident. No acute infarct, hemorrhage, or mass lesion is present. Ventricles are of normal size. No significant extra-axial collection is present. Vascular: Atherosclerotic calcifications are present within the cavernous internal carotid artery's bilaterally. No hyperdense vessel is present. Skull: A left frontal scalp hematoma is present. There is no underlying fracture. The calvarium is intact. No focal lytic or blastic lesions are present. Sinuses/Orbits: The paranasal sinuses and mastoid air cells are clear. The globes and orbits are within normal limits bilaterally. IMPRESSION: 1. Left frontal scalp hematoma without underlying fracture. 2. No acute intracranial abnormality. 3. Mild atrophy and white matter disease likely reflects the sequela of chronic microvascular ischemia. Electronically Signed   By: Marin Roberts M.D.   On: 12/01/2016 11:13   Ct Cervical Spine Wo Contrast  Result Date: 12/02/2016 CLINICAL DATA:  Status post unwitnessed fall. Bruising about the right forehead and right orbit. Vomiting. Concern for head or cervical  spine injury. Initial encounter. EXAM: CT HEAD WITHOUT CONTRAST CT MAXILLOFACIAL WITHOUT CONTRAST CT CERVICAL SPINE WITHOUT CONTRAST TECHNIQUE: Multidetector CT imaging of the head, cervical spine, and maxillofacial structures were performed using the standard protocol without intravenous contrast. Multiplanar CT image reconstructions of the cervical spine and maxillofacial structures were also generated. COMPARISON:  CT of the head performed 12/01/2016 FINDINGS: CT HEAD FINDINGS Brain: There is suggestion of a small 5 mm acute subdural hematoma overlying the left parietal lobe, new from the recent prior study. Prominence of the ventricles and sulci reflects mild to moderate cortical volume loss. Scattered periventricular and subcortical white matter change likely reflects small vessel ischemic microangiopathy. The brainstem and fourth ventricle are within normal limits. The basal ganglia are unremarkable in appearance. The cerebral hemispheres demonstrate grossly normal gray-white differentiation. No mass effect or midline shift is seen. Vascular: No hyperdense vessel or unexpected calcification. Skull: There is no evidence of fracture; visualized osseous structures are unremarkable in appearance. Other: Prominent soft tissue swelling is noted overlying the right frontal and parietal calvarium, and surrounding the right orbit. Mild soft tissue air at the right side of the nose may reflect an underlying soft tissue laceration. CT MAXILLOFACIAL FINDINGS Osseous: There is no evidence of fracture or dislocation. The maxilla and mandible appear  intact. The nasal bone is unremarkable in appearance. Maxillary and mandibular dental caries are noted. Orbits: The orbits are intact bilaterally. Sinuses: Blood is noted partially filling the right maxillary sinus. The remaining visualized paranasal sinuses and mastoid air cells are well-aerated. Soft tissues: Prominent soft tissue swelling noted overlying the right frontal and  parietal calvarium, and surrounding the right orbit to. The parapharyngeal fat planes are preserved. The nasopharynx, oropharynx and hypopharynx are unremarkable in appearance. The visualized portions of the valleculae and piriform sinuses are grossly unremarkable. The parotid and submandibular glands are within normal limits. No cervical lymphadenopathy is seen. CT CERVICAL SPINE FINDINGS Alignment: Normal. Skull base and vertebrae: No acute fracture. No primary bone lesion or focal pathologic process. Soft tissues and spinal canal: No prevertebral fluid or swelling. No visible canal hematoma. Disc levels: Mild intervertebral disc space narrowing is noted at C3-C4 and at C6-C7. Underlying facet disease is noted at the mid cervical spine. Upper chest: A 3.4 cm hypodensity is noted at the right thyroid lobe. The visualized lung apices are clear. Other: No additional soft tissue abnormalities are seen. IMPRESSION: 1. Suggestion of small 5 mm acute subdural hematoma overlying the left parietal lobe, new from the recent prior study. 2. No evidence of fracture or subluxation along the cervical spine. 3. Prominent soft tissue swelling overlying the right frontal and parietal calvarium, and surrounding the right orbit. Mild soft tissue air at the right side of the nose may reflect an underlying soft tissue laceration. 4. Blood noted partially filling the right maxillary sinus, new from the recent prior study. 5. Mild to moderate cortical volume loss and scattered small vessel ischemic microangiopathy. 6. Maxillary and mandibular dental caries noted. 7. Mild degenerative change along the cervical spine. 8. **An incidental finding of potential clinical significance has been found. 3.4 cm hypodensity at the right thyroid lobe. Recommend further evaluation with thyroid ultrasound. If patient is clinically hyperthyroid, consider nuclear medicine thyroid uptake and scan.** Critical Value/emergent results were called by telephone  at the time of interpretation on 12/02/2016 at 3:04 am to Dr. Glynn Octave, who verbally acknowledged these results. Electronically Signed   By: Roanna Raider M.D.   On: 12/02/2016 03:05   Dg Shoulder Left  Result Date: 12/02/2016 CLINICAL DATA:  Status post fall, with left shoulder bruising. Initial encounter. EXAM: LEFT SHOULDER - 2+ VIEW COMPARISON:  Left shoulder radiographs performed 12/01/2016 FINDINGS: There is no evidence of fracture or dislocation. The patient's left shoulder arthroplasty appears grossly intact, without evidence of loosening. Degenerative change is noted about the glenoid. Mild degenerative change is noted at the left acromioclavicular joint. No definite soft tissue abnormalities are characterized on radiograph. IMPRESSION: No evidence of fracture or dislocation. Left shoulder arthroplasty appears grossly intact, without evidence of loosening. Electronically Signed   By: Roanna Raider M.D.   On: 12/02/2016 02:07   Dg Shoulder Left  Result Date: 12/01/2016 CLINICAL DATA:  Fall. EXAM: LEFT SHOULDER - 2+ VIEW COMPARISON:  None. FINDINGS: Left shoulder hemi arthroplasty device is identified. The hardware components are in anatomic alignment. No periprosthetic fracture. Mild degenerative changes noted at the acromioclavicular joint. IMPRESSION: 1. No acute finding. 2. Previous left shoulder arthroplasty. Electronically Signed   By: Signa Kell M.D.   On: 12/01/2016 10:01   Dg Knee Complete 4 Views Left  Result Date: 12/02/2016 CLINICAL DATA:  Status post unwitnessed fall, with left knee bruising. Initial encounter. EXAM: LEFT KNEE - COMPLETE 4+ VIEW COMPARISON:  None. FINDINGS: There is  no evidence of fracture or dislocation. The patient's total knee arthroplasty is grossly unremarkable in appearance, without evidence of loosening. A small knee joint effusion noted. Mild diffuse soft tissue swelling is noted about the patella. Degenerative osseous fragments are noted about the  distal quadriceps tendon. Scattered vascular calcifications are seen. IMPRESSION: 1. No evidence of fracture or dislocation. 2. Total knee arthroplasty is grossly unremarkable, without evidence of loosening. 3. Small knee joint effusion noted. 4. Scattered vascular calcifications seen. Electronically Signed   By: Roanna Raider M.D.   On: 12/02/2016 02:05   Dg Hand Complete Left  Result Date: 12/02/2016 CLINICAL DATA:  Left hand pain after a fall, hematoma EXAM: LEFT HAND - COMPLETE 3+ VIEW COMPARISON:  None. FINDINGS: Examination is limited by a pose a shin Ing. Irregular linear lucencies are visualized within the distal phalanx of the first digit at the proximal mid and distal aspect. No subluxation. Advanced arthritic changes at the first [facility] joint. Marked DIP and PIP joint arthritis with narrowing and osteophyte. IMPRESSION: 1. Multiple irregular lucencies within the proximal mid and distal aspect of the first distal phalanx, suspicious for nondisplaced fractures. 2. Marked arthritic change at the wrist and digits. Electronically Signed   By: Jasmine Pang M.D.   On: 12/02/2016 02:59   Ct Maxillofacial Wo Contrast  Result Date: 12/02/2016 CLINICAL DATA:  Status post unwitnessed fall. Bruising about the right forehead and right orbit. Vomiting. Concern for head or cervical spine injury. Initial encounter. EXAM: CT HEAD WITHOUT CONTRAST CT MAXILLOFACIAL WITHOUT CONTRAST CT CERVICAL SPINE WITHOUT CONTRAST TECHNIQUE: Multidetector CT imaging of the head, cervical spine, and maxillofacial structures were performed using the standard protocol without intravenous contrast. Multiplanar CT image reconstructions of the cervical spine and maxillofacial structures were also generated. COMPARISON:  CT of the head performed 12/01/2016 FINDINGS: CT HEAD FINDINGS Brain: There is suggestion of a small 5 mm acute subdural hematoma overlying the left parietal lobe, new from the recent prior study. Prominence of the  ventricles and sulci reflects mild to moderate cortical volume loss. Scattered periventricular and subcortical white matter change likely reflects small vessel ischemic microangiopathy. The brainstem and fourth ventricle are within normal limits. The basal ganglia are unremarkable in appearance. The cerebral hemispheres demonstrate grossly normal gray-white differentiation. No mass effect or midline shift is seen. Vascular: No hyperdense vessel or unexpected calcification. Skull: There is no evidence of fracture; visualized osseous structures are unremarkable in appearance. Other: Prominent soft tissue swelling is noted overlying the right frontal and parietal calvarium, and surrounding the right orbit. Mild soft tissue air at the right side of the nose may reflect an underlying soft tissue laceration. CT MAXILLOFACIAL FINDINGS Osseous: There is no evidence of fracture or dislocation. The maxilla and mandible appear intact. The nasal bone is unremarkable in appearance. Maxillary and mandibular dental caries are noted. Orbits: The orbits are intact bilaterally. Sinuses: Blood is noted partially filling the right maxillary sinus. The remaining visualized paranasal sinuses and mastoid air cells are well-aerated. Soft tissues: Prominent soft tissue swelling noted overlying the right frontal and parietal calvarium, and surrounding the right orbit to. The parapharyngeal fat planes are preserved. The nasopharynx, oropharynx and hypopharynx are unremarkable in appearance. The visualized portions of the valleculae and piriform sinuses are grossly unremarkable. The parotid and submandibular glands are within normal limits. No cervical lymphadenopathy is seen. CT CERVICAL SPINE FINDINGS Alignment: Normal. Skull base and vertebrae: No acute fracture. No primary bone lesion or focal pathologic process. Soft tissues and  spinal canal: No prevertebral fluid or swelling. No visible canal hematoma. Disc levels: Mild intervertebral  disc space narrowing is noted at C3-C4 and at C6-C7. Underlying facet disease is noted at the mid cervical spine. Upper chest: A 3.4 cm hypodensity is noted at the right thyroid lobe. The visualized lung apices are clear. Other: No additional soft tissue abnormalities are seen. IMPRESSION: 1. Suggestion of small 5 mm acute subdural hematoma overlying the left parietal lobe, new from the recent prior study. 2. No evidence of fracture or subluxation along the cervical spine. 3. Prominent soft tissue swelling overlying the right frontal and parietal calvarium, and surrounding the right orbit. Mild soft tissue air at the right side of the nose may reflect an underlying soft tissue laceration. 4. Blood noted partially filling the right maxillary sinus, new from the recent prior study. 5. Mild to moderate cortical volume loss and scattered small vessel ischemic microangiopathy. 6. Maxillary and mandibular dental caries noted. 7. Mild degenerative change along the cervical spine. 8. **An incidental finding of potential clinical significance has been found. 3.4 cm hypodensity at the right thyroid lobe. Recommend further evaluation with thyroid ultrasound. If patient is clinically hyperthyroid, consider nuclear medicine thyroid uptake and scan.** Critical Value/emergent results were called by telephone at the time of interpretation on 12/02/2016 at 3:04 am to Dr. Glynn OctaveSTEPHEN RANCOUR, who verbally acknowledged these results. Electronically Signed   By: Roanna RaiderJeffery  Chang M.D.   On: 12/02/2016 03:05       Subjective: Stable on telemetry. No change in mental status. Has not been agitated or required Ativan.  Discharge Exam: Vitals:   12/04/16 0720 12/04/16 1118  BP: (!) 144/80 106/70  Pulse: 63   Resp: 19   Temp: 98.1 F (36.7 C) 98.3 F (36.8 C)   Vitals:   12/03/16 2350 12/04/16 0654 12/04/16 0720 12/04/16 1118  BP: 130/75 (!) 144/80 (!) 144/80 106/70  Pulse: 75 (!) 40 63   Resp: 20 15 19    Temp: 98.4 F (36.9  C) 97.8 F (36.6 C) 98.1 F (36.7 C) 98.3 F (36.8 C)  TempSrc: Axillary Axillary Axillary Oral  SpO2: 96% 98% 94%   Weight:      Height:        Gen: not in distress HEENT: ecchymoses of rt periorbital area (Improving), pupils reactive b/l,  moist mucosa, supple neck Chest: clear b/l, no added sounds CVS: N S1&S2, no murmurs, rubs or gallop GI: soft, NT, ND,  Musculoskeletal: warm, no edema, splinting over left hand CNS:  moves all limbs, not oriented    The results of significant diagnostics from this hospitalization (including imaging, microbiology, ancillary and laboratory) are listed below for reference.     Microbiology: Recent Results (from the past 240 hour(s))  MRSA PCR Screening     Status: None   Collection Time: 12/02/16 11:48 PM  Result Value Ref Range Status   MRSA by PCR NEGATIVE NEGATIVE Final    Comment:        The GeneXpert MRSA Assay (FDA approved for NASAL specimens only), is one component of a comprehensive MRSA colonization surveillance program. It is not intended to diagnose MRSA infection nor to guide or monitor treatment for MRSA infections.      Labs: BNP (last 3 results) No results for input(s): BNP in the last 8760 hours. Basic Metabolic Panel:  Recent Labs Lab 12/01/16 1007 12/02/16 0041 12/03/16 0239 12/03/16 1207 12/04/16 0407  NA 140 138 137 138 137  K 3.7 3.0* 3.1*  2.9* 3.8  CL 102 101 102 101 102  CO2 29 27 24 28 26   GLUCOSE 93 132* 100* 112* 99  BUN 16 16 11 11  21*  CREATININE 0.71 0.80 0.61 0.69 0.80  CALCIUM 8.7* 8.6* 8.5* 8.5* 8.6*  MG  --   --  1.9  --   --    Liver Function Tests:  Recent Labs Lab 12/01/16 1007  AST 26  ALT 23  ALKPHOS 61  BILITOT 0.4  PROT 6.7  ALBUMIN 4.1   No results for input(s): LIPASE, AMYLASE in the last 168 hours. No results for input(s): AMMONIA in the last 168 hours. CBC:  Recent Labs Lab 12/01/16 1007 12/02/16 0041 12/03/16 0239  WBC 8.6 13.9* 11.0*  NEUTROABS 6.5  12.2*  --   HGB 11.7* 11.0* 12.1*  HCT 36.6* 34.9* 37.1*  MCV 89.5 89.3 88.3  PLT 185 191 191   Cardiac Enzymes:  Recent Labs Lab 12/02/16 0041  TROPONINI <0.03   BNP: Invalid input(s): POCBNP CBG:  Recent Labs Lab 12/02/16 1247  GLUCAP 93   D-Dimer No results for input(s): DDIMER in the last 72 hours. Hgb A1c No results for input(s): HGBA1C in the last 72 hours. Lipid Profile No results for input(s): CHOL, HDL, LDLCALC, TRIG, CHOLHDL, LDLDIRECT in the last 72 hours. Thyroid function studies No results for input(s): TSH, T4TOTAL, T3FREE, THYROIDAB in the last 72 hours.  Invalid input(s): FREET3 Anemia work up No results for input(s): VITAMINB12, FOLATE, FERRITIN, TIBC, IRON, RETICCTPCT in the last 72 hours. Urinalysis    Component Value Date/Time   COLORURINE STRAW (A) 12/02/2016 2345   APPEARANCEUR CLEAR 12/02/2016 2345   LABSPEC 1.009 12/02/2016 2345   PHURINE 7.0 12/02/2016 2345   GLUCOSEU NEGATIVE 12/02/2016 2345   HGBUR SMALL (A) 12/02/2016 2345   BILIRUBINUR NEGATIVE 12/02/2016 2345   KETONESUR 5 (A) 12/02/2016 2345   PROTEINUR NEGATIVE 12/02/2016 2345   NITRITE NEGATIVE 12/02/2016 2345   LEUKOCYTESUR NEGATIVE 12/02/2016 2345   Sepsis Labs Invalid input(s): PROCALCITONIN,  WBC,  LACTICIDVEN Microbiology Recent Results (from the past 240 hour(s))  MRSA PCR Screening     Status: None   Collection Time: 12/02/16 11:48 PM  Result Value Ref Range Status   MRSA by PCR NEGATIVE NEGATIVE Final    Comment:        The GeneXpert MRSA Assay (FDA approved for NASAL specimens only), is one component of a comprehensive MRSA colonization surveillance program. It is not intended to diagnose MRSA infection nor to guide or monitor treatment for MRSA infections.      Time coordinating discharge: Over 30 minutes  SIGNED:   Eddie North, MD  [facility] 01-10-2017, 3:27 PM Pager   If 7PM-7AM, please contact  night-coverage [email or web address] Password [email or web address]

## 2016-12-04 NOTE — Progress Notes (Signed)
Physical Therapy Treatment Patient Details Name: Matthew Hancock MRN: [id number] DOB: 02/03/1928 Today's Date: 10/30/2016    History of Present Illness Pt is a 83 yo male with subdermal hematoma resulting from mechanical fall on 12/01/16. Pt release back to his memory care facility where he experienced a second mechanical fall on 12/02/16 resulting in fx L thumb. PMH significant for dementian, and hypertension.    PT Comments    Pt with increased mobility today. Pt currently, min guard for bed mobility, modAx2 for transfer with HHA, and maxAx2 for ambulation of 15 feet with HHA. Pt is limited by his advanced dementia and is unable to properly, safely use any novel assistive devices such as RW or wheelchair. Pt requires skilled PT at discharge to continue to progress gait training in purposeful activities to maintain his LE strength to improve safe mobilization in his discharge environment.     Follow Up Recommendations  Other (comment);Supervision/Assistance - 24 hour (memory care facility)     Equipment Recommendations  None recommended by PT    Recommendations for Other Services OT consult     Precautions / Restrictions Precautions Precautions: Fall Precaution Comments: 2 falls within 2 days Restrictions Weight Bearing Restrictions: No    Mobility  Bed Mobility Overal bed mobility: Needs Assistance Bed Mobility: Supine to Sit     Supine to sit: HOB elevated;Min guard     General bed mobility comments: min guard for safety, vc for scooting forward to EoB before standing up  Transfers Overall transfer level: Needs assistance Equipment used: 2 person hand held assist Transfers: Sit to/from Stand Sit to Stand: +2 physical assistance;Mod assist         General transfer comment: modA for steadying max verbal cues to wait for donning of pull up before ambulation, decreased safety awareness and selective following of commands   Ambulation/Gait Ambulation/Gait assistance: Max  assist;+2 physical assistance;+2 safety/equipment Ambulation Distance (Feet): 15 Feet Assistive device: 2 person hand held assist Gait Pattern/deviations: Shuffle;Decreased step length - right;Decreased step length - left;Step-through pattern;Trunk flexed Gait velocity: slowed Gait velocity interpretation: Below normal speed for age/gender General Gait Details: Pt could not follow commands to safely use RW for ambulation, requiresd 2xHHA instead.  maxAx2 for safety and steadying of 2xLoB, decreased command following and increased desire to use furniture, sink and door to steady himself. Pt could not be disuaded from going into the bathroom to use the toilet             Balance Overall balance assessment: Needs assistance Sitting-balance support: Feet supported;Bilateral upper extremity supported Sitting balance-Leahy Scale: Fair     Standing balance support: Bilateral upper extremity supported Standing balance-Leahy Scale: Poor Standing balance comment: requires assist of 2 for steadying                             Cognition Arousal/Alertness: Awake/alert Behavior During Therapy: Flat affect;Impulsive;Agitated Overall Cognitive Status: History of cognitive impairments - at baseline                                 General Comments: pt inconsistently follows single step commands when aligned with his needs like getting up and going to the bathroom      Exercises      General Comments General comments (skin integrity, edema, etc.): Pt daughter in room throughout session and educated on pt inability to learn  to use safety devices secondary to his advanced dementia and that he would require 24 hour increased assistance to insure her father's safety      Pertinent Vitals/Pain Pain Assessment: Faces Faces Pain Scale: Hurts a little bit Pain Location: L hand Pain Descriptors / Indicators: Grimacing;Guarding (with use of L hand) Pain Intervention(s):  Monitored during session  VSS           PT Goals (current goals can now be found in the care plan section) Acute Rehab PT Goals Patient Stated Goal: to go home and get warm PT Goal Formulation: Patient unable to participate in goal setting Time For Goal Achievement: 12/17/16 Potential to Achieve Goals: Poor Progress towards PT goals: Progressing toward goals    Frequency    Min 2X/week      PT Plan Discharge plan needs to be updated       AM-PAC PT "6 Clicks" Daily Activity  Outcome Measure  Difficulty turning over in bed (including adjusting bedclothes, sheets and blankets)?: A Lot Difficulty moving from lying on back to sitting on the side of the bed? : Total Difficulty sitting down on and standing up from a chair with arms (e.g., wheelchair, bedside commode, etc,.)?: Total Help needed moving to and from a bed to chair (including a wheelchair)?: A Lot Help needed walking in hospital room?: A Lot Help needed climbing 3-5 steps with a railing? : Total 6 Click Score: 9    End of Session Equipment Utilized During Treatment: Gait belt Activity Tolerance: Treatment limited secondary to agitation Patient left: in chair;with call bell/phone within reach;with nursing/sitter in room;with chair alarm set Nurse Communication: Mobility status PT Visit Diagnosis: Unsteadiness on feet (R26.81);Other abnormalities of gait and mobility (R26.89);Repeated falls (R29.6);History of falling (Z91.81);Difficulty in walking, not elsewhere classified (R26.2);Other symptoms and signs involving the nervous system (R29.898);Pain Pain - Right/Left: Left Pain - part of body: Hand     Time: 7829-5621 PT Time Calculation (min) (ACUTE ONLY): 55 min  Charges:  $Gait Training: 8-22 mins $Therapeutic Activity: 38-52 mins                    G Codes:  Functional Assessment Tool Used: AM-PAC 6 Clicks Basic Mobility Functional Limitation: Mobility: Walking and moving around Mobility: Walking and  Moving Around Current Status (H0865): At least 60 percent but less than 80 percent impaired, limited or restricted Mobility: Walking and Moving Around Goal Status [phone]): At least 20 percent but less than 40 percent impaired, limited or restricted    Lanora Manis B. Beverely Risen PT, DPT Acute Rehabilitation  [phone] Pager [phone]     Elon Alas Fleet Feb 17, 202018, 6:14 PM

## 2016-12-12 ENCOUNTER — Encounter (HOSPITAL_COMMUNITY): Payer: Self-pay | Admitting: *Deleted

## 2016-12-12 ENCOUNTER — Emergency Department (HOSPITAL_COMMUNITY)
Admission: EM | Admit: 2016-12-12 | Discharge: 2016-12-12 | Disposition: A | Discharge status: Home / Self Care | Payer: Medicare Other | Attending: Emergency Medicine | Admitting: Emergency Medicine

## 2016-12-12 ENCOUNTER — Emergency Department (HOSPITAL_COMMUNITY): Payer: Medicare Other

## 2016-12-12 DIAGNOSIS — I10 Essential (primary) hypertension: Secondary | ICD-10-CM | POA: Diagnosis not present

## 2016-12-12 DIAGNOSIS — F0390 Unspecified dementia without behavioral disturbance: Secondary | ICD-10-CM | POA: Diagnosis not present

## 2016-12-12 DIAGNOSIS — S065X0S Traumatic subdural hemorrhage without loss of consciousness, sequela: Secondary | ICD-10-CM | POA: Diagnosis not present

## 2016-12-12 DIAGNOSIS — W182XXS Fall in (into) shower or empty bathtub, sequela: Secondary | ICD-10-CM | POA: Insufficient documentation

## 2016-12-12 DIAGNOSIS — S069X0S Unspecified intracranial injury without loss of consciousness, sequela: Secondary | ICD-10-CM

## 2016-12-12 DIAGNOSIS — I6203 Nontraumatic chronic subdural hemorrhage: Secondary | ICD-10-CM

## 2016-12-12 DIAGNOSIS — R4182 Altered mental status, unspecified: Secondary | ICD-10-CM | POA: Diagnosis present

## 2016-12-12 DIAGNOSIS — Z79899 Other long term (current) drug therapy: Secondary | ICD-10-CM | POA: Diagnosis not present

## 2016-12-12 LAB — COMPREHENSIVE METABOLIC PANEL
ALBUMIN: 3.8 g/dL (ref 3.5–5.0)
ALK PHOS: 89 U/L (ref 38–126)
ALT: 19 U/L (ref 17–63)
AST: 21 U/L (ref 15–41)
Anion gap: 7 (ref 5–15)
BUN: 15 mg/dL (ref 6–20)
CALCIUM: 9 mg/dL (ref 8.9–10.3)
CO2: 29 mmol/L (ref 22–32)
CREATININE: 1.01 mg/dL (ref 0.61–1.24)
Chloride: 104 mmol/L (ref 101–111)
GFR calc Af Amer: >60 mL/min (ref >60)
GFR calc non Af Amer: >60 mL/min (ref >60)
GLUCOSE: 107 mg/dL — AB (ref 65–99)
Potassium: 3.9 mmol/L (ref 3.5–5.1)
SODIUM: 140 mmol/L (ref 135–145)
Total Bilirubin: 0.6 mg/dL (ref 0.3–1.2)
Total Protein: 6.3 g/dL — ABNORMAL LOW (ref 6.5–8.1)

## 2016-12-12 LAB — CBC
HCT: 36.6 % — ABNORMAL LOW (ref 39.0–52.0)
Hemoglobin: 11.7 g/dL — ABNORMAL LOW (ref 13.0–17.0)
MCH: 28.6 pg (ref 26.0–34.0)
MCHC: 32 g/dL (ref 30.0–36.0)
MCV: 89.5 fL (ref 78.0–100.0)
PLATELETS: 274 10*3/uL (ref 150–400)
RBC: 4.09 MIL/uL — ABNORMAL LOW (ref 4.22–5.81)
RDW: 13.4 % (ref 11.5–15.5)
WBC: 8.3 10*3/uL (ref 4.0–10.5)

## 2016-12-12 LAB — I-STAT CG4 LACTIC ACID, ED: LACTIC ACID, VENOUS: 1.21 mmol/L (ref 0.5–1.9)

## 2016-12-12 MED ORDER — LORAZEPAM 2 MG/ML IJ SOLN
0.5000 mg | Freq: Once | INTRAMUSCULAR | Status: AC
  Administered 2016-12-12: 0.5 mg via INTRAVENOUS

## 2016-12-12 MED ORDER — LORAZEPAM 0.5 MG PO TABS: 1.0000 mg | ORAL_TABLET | Freq: Two times a day (BID) | ORAL | 0 refills | Status: DC | PRN

## 2016-12-12 MED ORDER — LORAZEPAM 0.5 MG PO TABS: 0.5000 mg | ORAL_TABLET | Freq: Two times a day (BID) | ORAL | 0 refills | Status: AC | PRN

## 2016-12-12 MED ORDER — LORAZEPAM 1 MG PO TABS: 1.0000 mg | ORAL_TABLET | Freq: Two times a day (BID) | ORAL | 0 refills | Status: DC | PRN

## 2016-12-12 MED ORDER — LORAZEPAM 2 MG/ML IJ SOLN
INTRAMUSCULAR | Status: AC
  Administered 2016-12-12: 0.5 mg via INTRAVENOUS
  Filled 2016-12-12: qty 1

## 2016-12-12 NOTE — ED Notes (Signed)
Pt's family member asking to speak to ED Provider. Informed Dr. Rhunette CroftNanavati.

## 2016-12-12 NOTE — ED Notes (Signed)
Pt is very agitated and cofused, pulled out his IV, ripping off gown and cables.  EDP notified.  Gave pt urinal and changed him as well as bed

## 2016-12-12 NOTE — ED Notes (Signed)
ED Provider at bedside. 

## 2016-12-12 NOTE — ED Triage Notes (Signed)
Pt arrives from [facility]. SNF faculty called EMS and stated pt wouldn't follow commands or talk to them. Upon EMS arrival pt was alert and oriented to baseline and following simple commands. Pt has a hx of dementia and has multiple old bruises from a recent fall that resulted in a subdural bleed.

## 2016-12-12 NOTE — ED Notes (Signed)
Patient transported to CT 

## 2016-12-12 NOTE — Discharge Instructions (Signed)
The CT scan shows persistent stable subdural hematoma. We spoke with Neurosurgery, and they are certain that the bleed will not have caused any change. Mr. Matthew Hancock has also gotten more alert, and his blood pressure is stable- so we are comfortable with the discharge plan.

## 2016-12-12 NOTE — ED Notes (Signed)
Snack given, call to [facility] for transport back to memory care

## 2016-12-12 NOTE — ED Provider Notes (Signed)
[facility]-EMERGENCY DEPT Provider Note   CSN: [id number] Arrival date & time: 12/12/16  1239     History   Chief Complaint Chief Complaint  Patient presents with  . Altered Mental Status    HPI Matthew Hancock is a 83 y.o. male.  HPI Level 5 caveat for dementia.  Pt comes in with cc of somnolence. Pt resides at a SNF and had an admission for subdural hematoma that was medically managed last week. Pt sent to the ER as the nurses found him difficult to arouse and his BP was low. In the ER pt has stable vitals signs. Daughter at bedside, reports that pt had a full breakfast today. She reports that he occasionally is hard to arouse, but today he was very somnolent. No new meds started. Pt gets ativan prn.  Past Medical History:  Diagnosis Date  . Dementia   . Dementia   . Hypertension     Patient Active Problem List   Diagnosis Date Noted  . Prolonged QT interval 07-22-202018  . Nondisplaced fracture of distal phalanx of left thumb, initial encounter for closed fracture 07-22-202018  . Pressure injury of skin 12/03/2016  . Subdural hematoma (HCC) 12/02/2016  . Abnormal CT scan of head 12/02/2016  . Fall at home, initial encounter 12/02/2016  . Dementia 12/02/2016  . Leukocytosis 12/02/2016  . Hypokalemia 12/02/2016  . BPH (benign prostatic hyperplasia) 12/02/2016    Past Surgical History:  Procedure Laterality Date  . KNEE SURGERY Bilateral        Home Medications    Prior to Admission medications   Medication Sig Start Date End Date Taking? Authorizing Provider  B Complex Vitamins (VITAMIN-B COMPLEX PO) Take 1 tablet by mouth daily.    [provider]  Cholecalciferol (VITAMIN D) 2000 units CAPS Take 2,000 Units by mouth daily.    [provider]  levETIRAcetam (KEPPRA) 500 MG tablet Take 1 tablet (500 mg total) by mouth 2 (two) times daily. 12/04/16   Dhungel, Theda Belfast, MD  Magnesium Chloride-Calcium 64-106 MG TBEC Take 1 tablet by mouth every Monday,  Wednesday, and Friday.    [provider]  Melatonin 10 MG CAPS Take 10 mg by mouth at bedtime.    [provider]  memantine (NAMENDA) 10 MG tablet Take 10 mg by mouth 2 (two) times daily.    [provider]  mineral oil liquid Place 2 mLs in ear(s) once a week. On Thursday    [provider]  Multiple Vitamins-Minerals (MULTIVITAMIN WITH MINERALS) tablet Take 1 tablet by mouth daily.    [provider]  polyethylene glycol (MIRALAX / GLYCOLAX) packet Take 17 g by mouth every 3 (three) days.     [provider]  potassium chloride SA (K-DUR,KLOR-CON) 20 MEQ tablet Take 20 mEq by mouth daily.    [provider]    Family History Family History  Problem Relation Age of Onset  . Congestive Heart Failure Sister   . Lung cancer Brother     Social History Social History  Substance Use Topics  . Smoking status: Never Smoker  . Smokeless tobacco: Never Used  . Alcohol use No     Allergies   Patient has no known allergies.   Review of Systems Review of Systems  Unable to perform ROS: Dementia     Physical Exam Updated Vital Signs BP 130/77   Pulse 68   Temp 97.9 F (36.6 C) (Oral)   Resp 15   SpO2 97%  Physical Exam  Constitutional: He appears well-developed.  HENT:  Head: Atraumatic.  Neck: Neck supple.  Cardiovascular: Normal rate.   Pulmonary/Chest: Effort normal.  Neurological:  Somnolent, oriented x 1 Moving all 4 extremities  Skin: Skin is warm.  Nursing note and vitals reviewed.    ED Treatments / Results  Labs (all labs ordered are listed, but only abnormal results are displayed) Labs Reviewed  COMPREHENSIVE METABOLIC PANEL - Abnormal; Notable for the following:       Result Value   Glucose, Bld 107 (*)    Total Protein 6.3 (*)    All other components within normal limits  CBC - Abnormal; Notable for the following:    RBC 4.09 (*)    Hemoglobin 11.7 (*)    HCT 36.6 (*)    All other  components within normal limits  I-STAT CG4 LACTIC ACID, ED    EKG  EKG Interpretation  Date/Time:  Wednesday December 12 2016 12:41:33 EDT Ventricular Rate:  61 PR Interval:    QRS Duration: 166 QT Interval:  487 QTC Calculation: 491 R Axis:   17 Text Interpretation:  Sinus rhythm Prolonged PR interval Probable left atrial enlargement Right bundle branch block No acute changes No significant change since last tracing Confirmed by Derwood Kaplananavati, Lina Hitch ([id number]) on 12/12/2016 2:59:39 PM       Radiology Ct Head Wo Contrast  Result Date: 12/12/2016 CLINICAL DATA:  Dementia. Altered mental status. History of subdural bleed. EXAM: CT HEAD WITHOUT CONTRAST TECHNIQUE: Contiguous axial images were obtained from the base of the skull through the vertex without intravenous contrast. COMPARISON:  12/03/2016 FINDINGS: Brain: Small intermediate density subdural hematomas are again noted, 6 mm on the right and 5 mm on the left. This is not significantly changed since prior study. No intraparenchymal hemorrhage. No hydrocephalus or acute infarction. Mild age related atrophy and chronic microvascular disease. Vascular: No hyperdense vessel or unexpected calcification. Skull: No acute calvarial abnormality. Sinuses/Orbits: Visualized paranasal sinuses and mastoids clear. Orbital soft tissues unremarkable. Other: None IMPRESSION: Stable small intermediate density subdural hematomas. No mass effect or midline shift. No change. Electronically Signed   By: Charlett NoseKevin  Dover M.D.   On: 12/12/2016 15:28    Procedures Procedures (including critical care time)  Medications Ordered in ED Medications  LORazepam (ATIVAN) injection 0.5 mg (not administered)     Initial Impression / Assessment and Plan / ED Course  I have reviewed the triage vital signs and the nursing notes.  Pertinent labs & imaging results that were available during my care of the patient were reviewed by me and considered in my medical decision making  (see chart for details).    Pt comes in with cc of AMS. Recent subdural hematoma. PT has no hx of UTI and has no urinary complains or fevers. Repeat CT is unchanged. I spoke with Dr. Threasa Headsabelle, Neurosurgery, and we discussed the presentation. The CT bleed has expanded minimally, but Dr. Threasa Headsabelle doesn't think the pt's somnolence would be from SDH. Results from the ER workup discussed with the patient face to face and all questions answered to the best of my ability.    Final Clinical Impressions(s) / ED Diagnoses   Final diagnoses:  Traumatic brain injury, without loss of consciousness, sequela (HCC)  Chronic intracranial subdural hematoma (HCC)    New Prescriptions New Prescriptions   No medications on file     Derwood KaplanNanavati, Neelie Welshans, MD 12/12/16 1709

## 2016-12-12 NOTE — ED Notes (Signed)
[facility]oke with [facility], [place] and notified her that pt is being transported back POV by his family.

## 2016-12-21 ENCOUNTER — Encounter (HOSPITAL_COMMUNITY): Payer: Self-pay | Admitting: Emergency Medicine

## 2016-12-21 ENCOUNTER — Emergency Department (HOSPITAL_COMMUNITY)
Admission: EM | Admit: 2016-12-21 | Discharge: 2016-12-21 | Disposition: A | Discharge status: Home / Self Care | Payer: Medicare Other | Attending: Emergency Medicine | Admitting: Emergency Medicine

## 2016-12-21 DIAGNOSIS — R400 Somnolence: Secondary | ICD-10-CM | POA: Diagnosis not present

## 2016-12-21 DIAGNOSIS — R55 Syncope and collapse: Secondary | ICD-10-CM | POA: Diagnosis present

## 2016-12-21 DIAGNOSIS — T50901A Poisoning by unspecified drugs, medicaments and biological substances, accidental (unintentional), initial encounter: Secondary | ICD-10-CM | POA: Diagnosis not present

## 2016-12-21 DIAGNOSIS — I10 Essential (primary) hypertension: Secondary | ICD-10-CM | POA: Diagnosis not present

## 2016-12-21 DIAGNOSIS — Z79899 Other long term (current) drug therapy: Secondary | ICD-10-CM | POA: Diagnosis not present

## 2016-12-21 DIAGNOSIS — F0390 Unspecified dementia without behavioral disturbance: Secondary | ICD-10-CM | POA: Diagnosis not present

## 2016-12-21 LAB — I-STAT TROPONIN, ED: TROPONIN I, POC: 0 ng/mL (ref 0.00–0.08)

## 2016-12-21 LAB — COMPREHENSIVE METABOLIC PANEL
ALBUMIN: 3.9 g/dL (ref 3.5–5.0)
ALT: 16 U/L — AB (ref 17–63)
AST: 30 U/L (ref 15–41)
Alkaline Phosphatase: 103 U/L (ref 38–126)
Anion gap: 11 (ref 5–15)
BUN: 16 mg/dL (ref 6–20)
CHLORIDE: 107 mmol/L (ref 101–111)
CO2: 23 mmol/L (ref 22–32)
CREATININE: 0.86 mg/dL (ref 0.61–1.24)
Calcium: 8.6 mg/dL — ABNORMAL LOW (ref 8.9–10.3)
GFR calc Af Amer: >60 mL/min (ref >60)
GFR calc non Af Amer: >60 mL/min (ref >60)
Glucose, Bld: 93 mg/dL (ref 65–99)
Potassium: 4.6 mmol/L (ref 3.5–5.1)
SODIUM: 141 mmol/L (ref 135–145)
Total Bilirubin: 0.9 mg/dL (ref 0.3–1.2)
Total Protein: 6.2 g/dL — ABNORMAL LOW (ref 6.5–8.1)

## 2016-12-21 LAB — URINALYSIS, ROUTINE W REFLEX MICROSCOPIC
Bilirubin Urine: NEGATIVE
Glucose, UA: NEGATIVE mg/dL
HGB URINE DIPSTICK: NEGATIVE
Ketones, ur: NEGATIVE mg/dL
Leukocytes, UA: NEGATIVE
Nitrite: NEGATIVE
Protein, ur: NEGATIVE mg/dL
SPECIFIC GRAVITY, URINE: 1.009 (ref 1.005–1.030)
pH: 7 (ref 5.0–8.0)

## 2016-12-21 LAB — CBC WITH DIFFERENTIAL/PLATELET
BASOS ABS: 0 10*3/uL (ref 0.0–0.1)
Basophils Relative: 0 %
EOS ABS: 0.3 10*3/uL (ref 0.0–0.7)
EOS PCT: 3 %
HCT: 35.2 % — ABNORMAL LOW (ref 39.0–52.0)
Hemoglobin: 11.3 g/dL — ABNORMAL LOW (ref 13.0–17.0)
LYMPHS PCT: 12 %
Lymphs Abs: 1.2 10*3/uL (ref 0.7–4.0)
MCH: 28.8 pg (ref 26.0–34.0)
MCHC: 32.1 g/dL (ref 30.0–36.0)
MCV: 89.8 fL (ref 78.0–100.0)
Monocytes Absolute: 0.5 10*3/uL (ref 0.1–1.0)
Monocytes Relative: 5 %
Neutro Abs: 7.9 10*3/uL — ABNORMAL HIGH (ref 1.7–7.7)
Neutrophils Relative %: 80 %
PLATELETS: 241 10*3/uL (ref 150–400)
RBC: 3.92 MIL/uL — AB (ref 4.22–5.81)
RDW: 13.5 % (ref 11.5–15.5)
WBC: 9.8 10*3/uL (ref 4.0–10.5)

## 2016-12-21 LAB — I-STAT CG4 LACTIC ACID, ED: Lactic Acid, Venous: 1.19 mmol/L (ref 0.5–1.9)

## 2016-12-21 NOTE — ED Notes (Signed)
Lunch tray at bedside. ?

## 2016-12-21 NOTE — ED Notes (Signed)
Pt ate 100% of lunch --  Enrique SackKendra from nsg home returned call -- concerned that pt has had 2 episodes of same, and remeron and seraquel have just been added after first episode. -- seraquel was increased to 3 times from 2 times a day , and added remeron, ativan was added AFTER last ED visit.

## 2016-12-21 NOTE — ED Notes (Signed)
Condom catheter applied.

## 2016-12-21 NOTE — ED Triage Notes (Signed)
Pt in from [facility] after being found unconscious in wheelchair. Per EMS, initial BP was 66/36 > given 58100ml's NS and recheck 128/74. When EMS arrived, pt was alert but unable to follow commands. Hx of recent subdural bleed and dementia, recently started Ativan, was given dose this morning for agitation. CBG 117

## 2016-12-21 NOTE — ED Notes (Signed)
4 assist to get pt into w/c and vehicle-- discussed with pt's family the option of [facility] for transportation to SNF for pt's safety. This nurse feels that it is in the pt's best interest for safety to be transported via [facility], not private vehicle.

## 2016-12-21 NOTE — ED Provider Notes (Signed)
[facility]-EMERGENCY DEPT Provider Note   CSN: [id number] Arrival date & time: 12/21/16  1022     History   Chief Complaint Chief Complaint  Patient presents with  . Loss of Consciousness    HPI Matthew Hancock is a 83 y.o. male.  HPI Low 5 caveat due to dementia. Patient recently diagnosed with bilateral subdural hematomas after fall. Has been treated conservatively. Patient presents from nursing home by EMS for increased drowsiness today. Husband is at bedside. States patient has been progressively confused since diagnosis of subdural. Patient is receiving Ativan when necessary for agitation. Patient's son does not believe there is been any recent falls or new trauma. Past Medical History:  Diagnosis Date  . Dementia   . Dementia   . Hypertension     Patient Active Problem List   Diagnosis Date Noted  . Prolonged QT interval Oct 29, 202018  . Nondisplaced fracture of distal phalanx of left thumb, initial encounter for closed fracture Oct 29, 202018  . Pressure injury of skin 12/03/2016  . Subdural hematoma (HCC) 12/02/2016  . Abnormal CT scan of head 12/02/2016  . Fall at home, initial encounter 12/02/2016  . Dementia 12/02/2016  . Leukocytosis 12/02/2016  . Hypokalemia 12/02/2016  . BPH (benign prostatic hyperplasia) 12/02/2016    Past Surgical History:  Procedure Laterality Date  . KNEE SURGERY Bilateral        Home Medications    Prior to Admission medications   Medication Sig Start Date End Date Taking? Authorizing Provider  B Complex Vitamins (VITAMIN-B COMPLEX PO) Take 1 tablet by mouth daily.   Yes [provider]  Cholecalciferol (VITAMIN D) 2000 units CAPS Take 2,000 Units by mouth daily.   Yes [provider]  ENSURE (ENSURE) Take 1 Can by mouth 2 (two) times daily between meals.    Yes [provider]  LORazepam (ATIVAN) 0.5 MG tablet Take 1 tablet (0.5 mg total) by mouth every 12 (twelve) hours as needed for anxiety or sedation. 12/12/16   Yes Derwood Kaplan, MD  Magnesium Chloride-Calcium 64-106 MG TBEC Take 1 tablet by mouth every Monday, Wednesday, and Friday.    Yes [provider]  Melatonin 10 MG CAPS Take 10 mg by mouth at bedtime.   Yes [provider]  memantine (NAMENDA) 10 MG tablet Take 10 mg by mouth 2 (two) times daily.   Yes [provider]  mineral oil liquid Place 1 mL in ear(s) See admin instructions. 2 drops in each ear every Wednesday   Yes [provider]  Multiple Vitamins-Minerals (MULTIVITAMIN WITH MINERALS) tablet Take 1 tablet by mouth daily.   Yes [provider]  polyethylene glycol (MIRALAX / GLYCOLAX) packet Take 17 g by mouth every 3 (three) days.    Yes [provider]  potassium chloride SA (K-DUR,KLOR-CON) 20 MEQ tablet Take 20 mEq by mouth daily.   Yes [provider]  QUEtiapine (SEROQUEL) 25 MG tablet Take 12.5 mg by mouth 3 (three) times daily.    Yes [provider]  levETIRAcetam (KEPPRA) 500 MG tablet Take 1 tablet (500 mg total) by mouth 2 (two) times daily. Patient not taking: Reported on 12/21/2016 12/04/16   Eddie North, MD    Family History Family History  Problem Relation Age of Onset  . Congestive Heart Failure Sister   . Lung cancer Brother     Social History Social History  Substance Use Topics  . Smoking status: Never Smoker  . Smokeless tobacco: Never Used  . Alcohol use  No     Allergies   Patient has no known allergies.   Review of Systems Review of Systems  Unable to perform ROS: Dementia     Physical Exam Updated Vital Signs BP 138/72   Pulse 60   Temp 97.8 F (36.6 C) (Rectal)   Resp 13   Ht 6\' 1"  (1.854 m)   Wt 74.8 kg (165 lb)   SpO2 97%   BMI 21.77 kg/m   Physical Exam  Constitutional: He appears well-developed and well-nourished.  HENT:  Head: Normocephalic.  Mouth/Throat: Oropharynx is clear and moist.  Patient with right periorbital ecchymosis that appears  subacute. No midface instability.  Eyes: EOM are normal. Pupils are equal, round, and reactive to light.  Pupils are pinpoint bilaterally.  Neck: Normal range of motion. Neck supple.  No meningismus or focal posterior cervical tenderness to palpation.  Cardiovascular: Normal rate and regular rhythm.   Pulmonary/Chest: Effort normal and breath sounds normal.  Abdominal: Soft. Bowel sounds are normal. There is no tenderness. There is no rebound and no guarding.  Musculoskeletal: Normal range of motion. He exhibits edema. He exhibits no tenderness.  Patient has subacute appearing ecchymosis to the left knee with small effusion. Distal pulses are 2+. Pelvis is stable.  Neurological:  Patient is lying in stretcher with eyes closed. Will follow simple commands. Appears to be moving all extremities.  Skin: Skin is warm and dry. No rash noted. No erythema.  Scattered ecchymosis  Nursing note and vitals reviewed.    ED Treatments / Results  Labs (all labs ordered are listed, but only abnormal results are displayed) Labs Reviewed  CBC WITH DIFFERENTIAL/PLATELET - Abnormal; Notable for the following:       Result Value   RBC 3.92 (*)    Hemoglobin 11.3 (*)    HCT 35.2 (*)    Neutro Abs 7.9 (*)    All other components within normal limits  COMPREHENSIVE METABOLIC PANEL - Abnormal; Notable for the following:    Calcium 8.6 (*)    Total Protein 6.2 (*)    ALT 16 (*)    All other components within normal limits  URINALYSIS, ROUTINE W REFLEX MICROSCOPIC - Abnormal; Notable for the following:    APPearance HAZY (*)    All other components within normal limits  I-STAT CG4 LACTIC ACID, ED  I-STAT TROPOININ, ED    EKG  EKG Interpretation  Date/Time:  Friday December 21 2016 10:25:25 EDT Ventricular Rate:  59 PR Interval:    QRS Duration: 178 QT Interval:  532 QTC Calculation: 528 R Axis:   70 Text Interpretation:  Sinus rhythm Atrial premature complexes Prolonged PR interval Right bundle  branch block Baseline wander in lead(s) I III aVR aVL V2 Confirmed by Ranae Palms  MD, Briasia Flinders ([id number]) on 12/21/2016 1:39:14 PM       Radiology No results found.  Procedures Procedures (including critical care time)  Medications Ordered in ED Medications - No data to display   Initial Impression / Assessment and Plan / ED Course  I have reviewed the triage vital signs and the nursing notes.  Pertinent labs & imaging results that were available during my care of the patient were reviewed by me and considered in my medical decision making (see chart for details).     Discussed patient's care at length with son. Through mutual decision making have decided to proceed with laboratory workup and delay CT head. If no cause for patient's mental status is found will consider  repeat head CT. Increased drowsiness likely due to polypharmacy. Family agrees with foregoing CT scan currently. Will discharge back to facility. Advised to have psych PA reevaluate medications for changes. Final Clinical Impressions(s) / ED Diagnoses   Final diagnoses:  Drowsiness  Polypharmacy    New Prescriptions Discharge Medication List as of 12/21/2016  1:51 PM       Loren RacerYelverton, Marshon Bangs, MD 12/23/16 [phone]

## 2016-12-21 NOTE — Discharge Instructions (Signed)
Concerned the patient is on multiple sedating medications which may contribute to his drowsiness and orthostasis. Hold Remeron until can be reevaluated by Psych PA.

## 2016-12-21 NOTE — ED Notes (Addendum)
Enrique SackKendra -- Care Coordinator RN from [facility] called to check on pt-- can be contacted at [phone].

## 2017-01-03 ENCOUNTER — Telehealth: Payer: Self-pay | Admitting: Cardiovascular Disease

## 2017-01-03 NOTE — Telephone Encounter (Signed)
Received records from [facility] for appointment on 02/08/17 with Dr Royann Shiversroitoru.  Records put with Dr Croitoru's schedule for 02/08/17. lp

## 2017-01-09 ENCOUNTER — Other Ambulatory Visit: Payer: Self-pay | Admitting: Neurological Surgery

## 2017-01-09 DIAGNOSIS — S065X9A Traumatic subdural hemorrhage with loss of consciousness of unspecified duration, initial encounter: Secondary | ICD-10-CM

## 2017-01-09 DIAGNOSIS — S065XAA Traumatic subdural hemorrhage with loss of consciousness status unknown, initial encounter: Secondary | ICD-10-CM

## 2017-01-14 ENCOUNTER — Ambulatory Visit
Admission: RE | Admit: 2017-01-14 | Discharge: 2017-01-14 | Disposition: A | Discharge status: Home / Self Care | Payer: Medicare Other | Source: Ambulatory Visit | Attending: Neurological Surgery | Admitting: Neurological Surgery

## 2017-01-14 DIAGNOSIS — S065X9A Traumatic subdural hemorrhage with loss of consciousness of unspecified duration, initial encounter: Secondary | ICD-10-CM

## 2017-01-14 DIAGNOSIS — S065XAA Traumatic subdural hemorrhage with loss of consciousness status unknown, initial encounter: Secondary | ICD-10-CM

## 2017-02-08 ENCOUNTER — Ambulatory Visit: Payer: Medicare Other | Admitting: Cardiovascular Disease

## 2017-09-19 IMAGING — CT CT HEAD W/O CM
3 of 8 series · 9 of 47 positions shown, 10 images · non-contrast
Comparison: 12/03/2016

CLINICAL DATA: Dementia. Altered mental status. History of subdural
bleed.

EXAM:
CT HEAD WITHOUT CONTRAST
TECHNIQUE: Contiguous axial images were obtained from the base of the skull
through the vertex without intravenous contrast.

[Series 3: head without · axial · non-contrast · 0.44mm/px · z∈[-172,-62]mm · 4 of 38 slices shown, 5 images]
[im 8/38  brain]
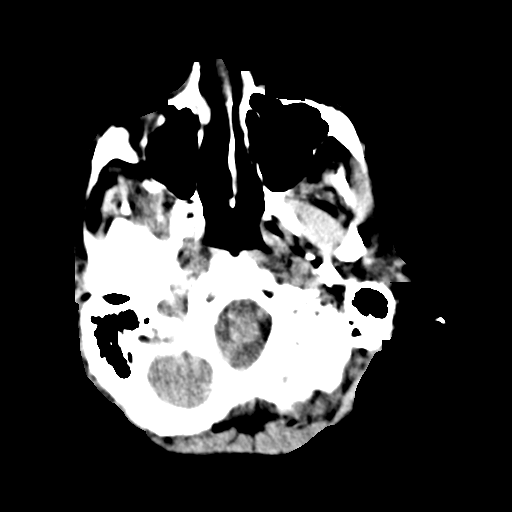
[im 8/38  bone]
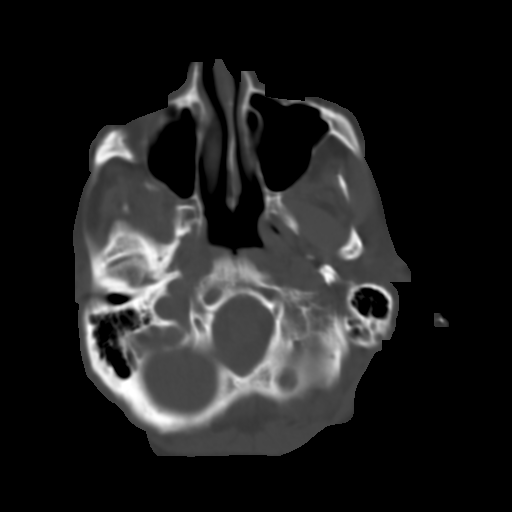
[im 15/38  brain]
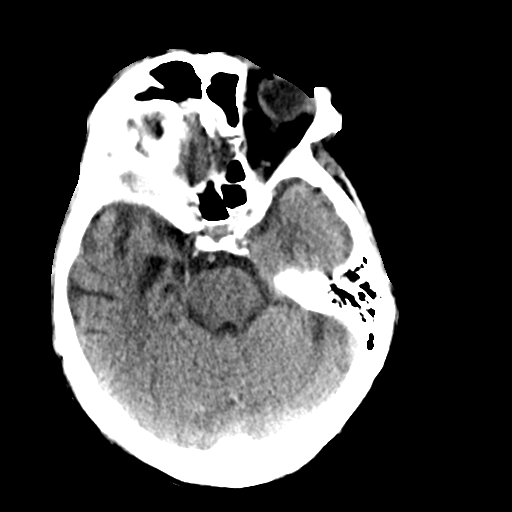
[im 23/38  brain]
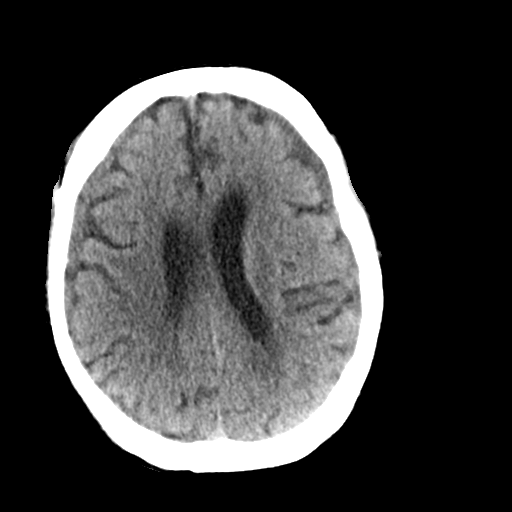
[im 30/38  brain]
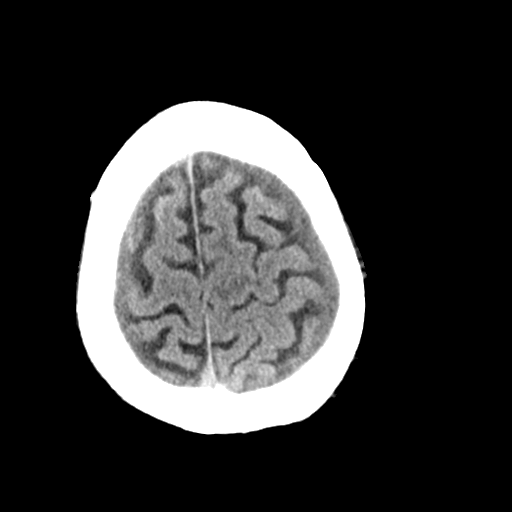

[Series 9: head without cor · coronal · non-contrast · 0.14mm/px · 3 of 70 slices shown]
[im 14/70  brain]
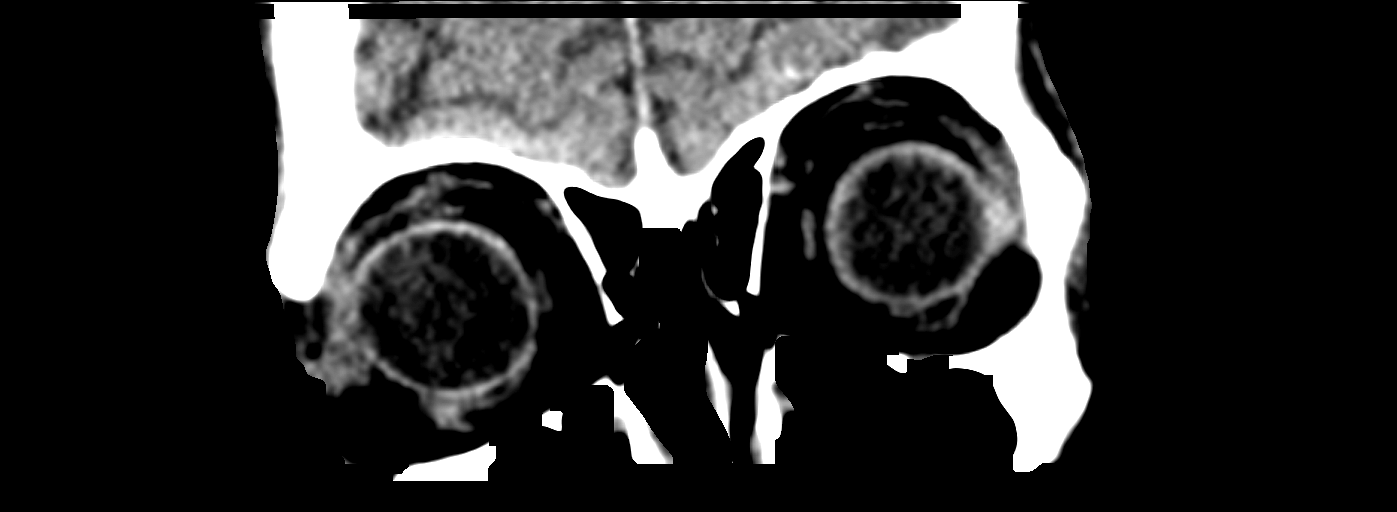
[im 28/70  brain]
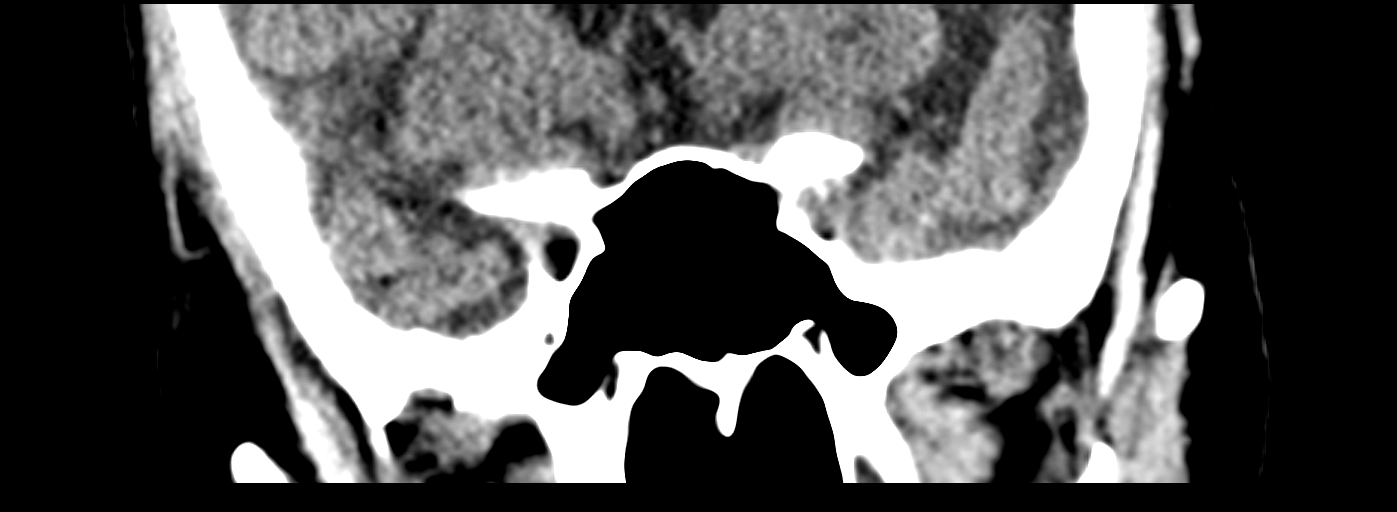
[im 42/70  brain]
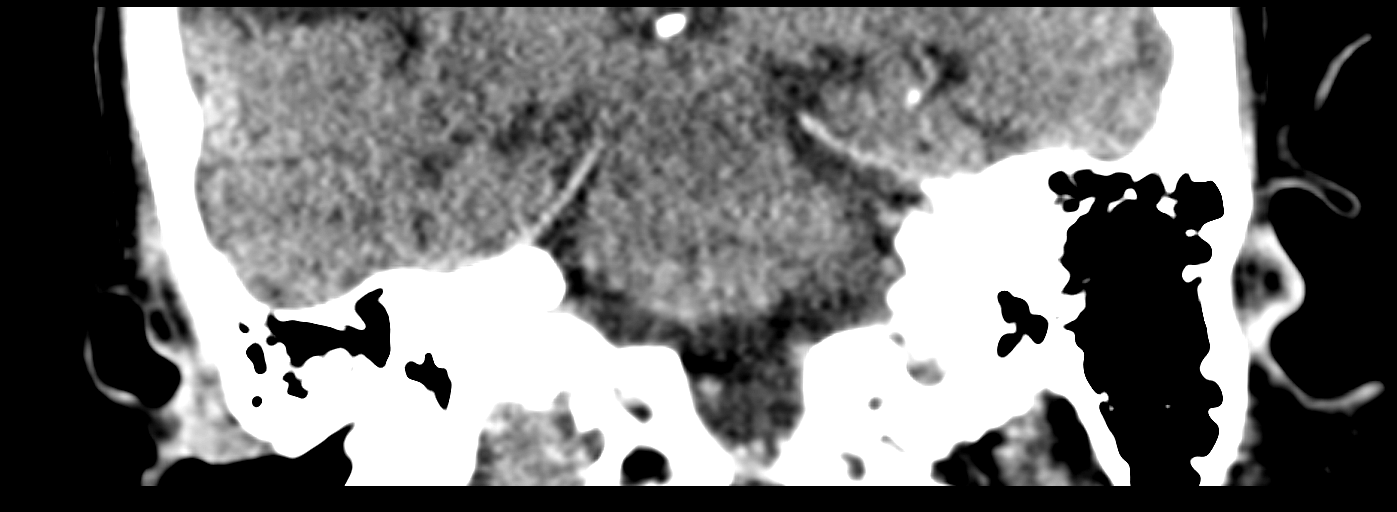

[Series 10: head without sag · sagittal · non-contrast · 0.13mm/px · 2 of 66 slices shown]
[im 22/66  brain]
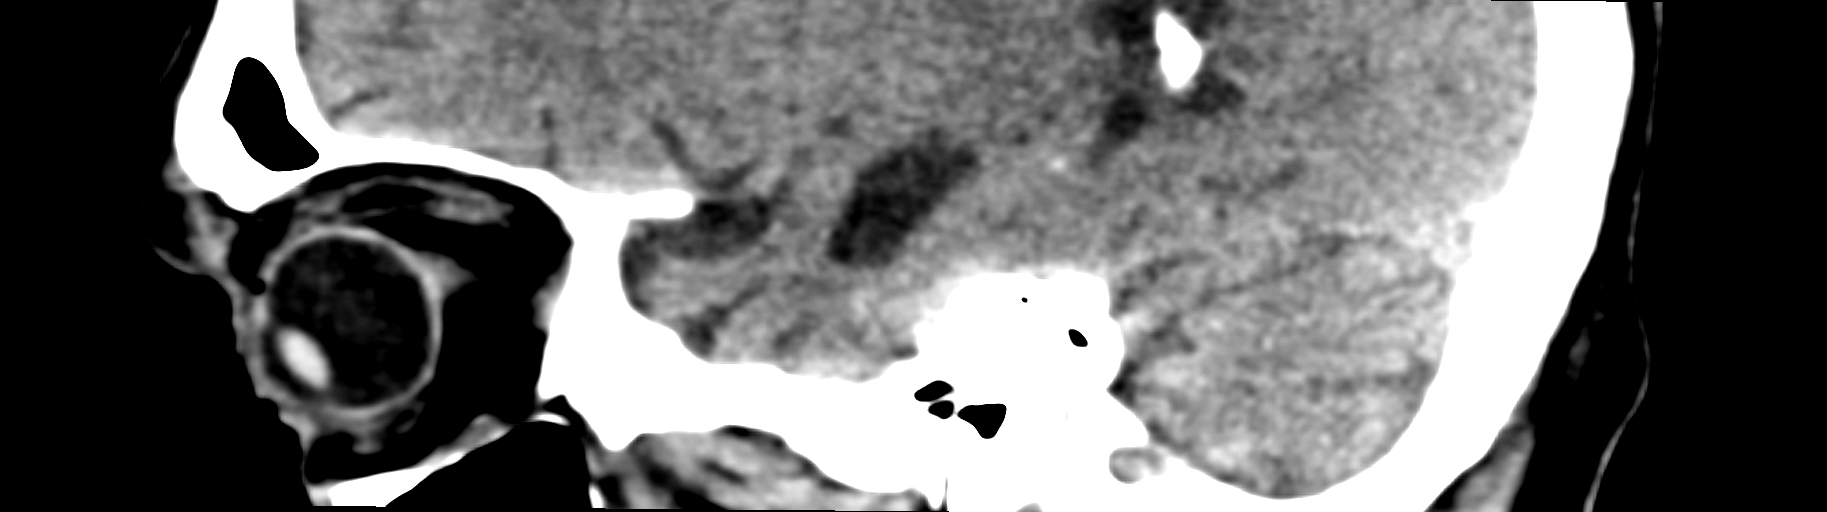
[im 44/66  brain]
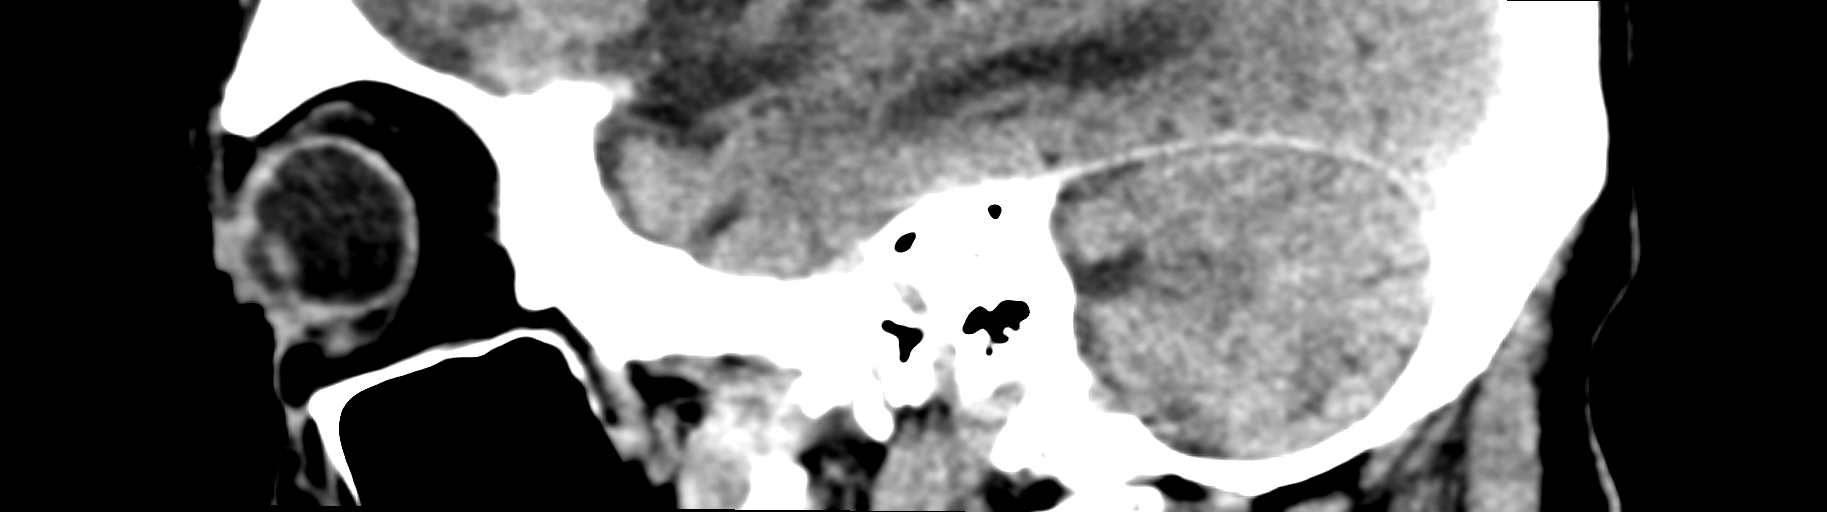

[9 of 47 positions shown; findings below may reference images not displayed]

FINDINGS: Brain: Small intermediate density subdural hematomas are again
noted, 6 mm on the right and 5 mm on the left. This is not
significantly changed since prior study. No intraparenchymal
hemorrhage. No hydrocephalus or acute infarction. Mild age related
atrophy and chronic microvascular disease.

Vascular: No hyperdense vessel or unexpected calcification.

Skull: No acute calvarial abnormality.

Sinuses/Orbits: Visualized paranasal sinuses and mastoids clear.
Orbital soft tissues unremarkable.

Other: None
IMPRESSION: Stable small intermediate density subdural hematomas. No mass effect
or midline shift. No change.

## 2018-12-17 DEATH — deceased
